# Patient Record
Sex: Male | Born: 1975 | Hispanic: Yes | Marital: Single | Smoking: Never smoker
Health system: Southern US, Community
[De-identification: ages and names within clinical notes are randomized; demographics above are authoritative.]

---

## 2008-12-07 ENCOUNTER — Emergency Department (HOSPITAL_COMMUNITY): Admission: EM | Admit: 2008-12-07 | Discharge: 2008-12-07 | Payer: Self-pay | Admitting: Emergency Medicine

## 2010-02-09 IMAGING — CR DG RIBS BILAT 3V
3 series · 3 of 3 positions shown · non-contrast
Comparison: None

CLINICAL DATA: Fall with bilateral rib pain.

BILATERAL RIBS - 3+ VIEW

[w ribs ap/pa upper right]
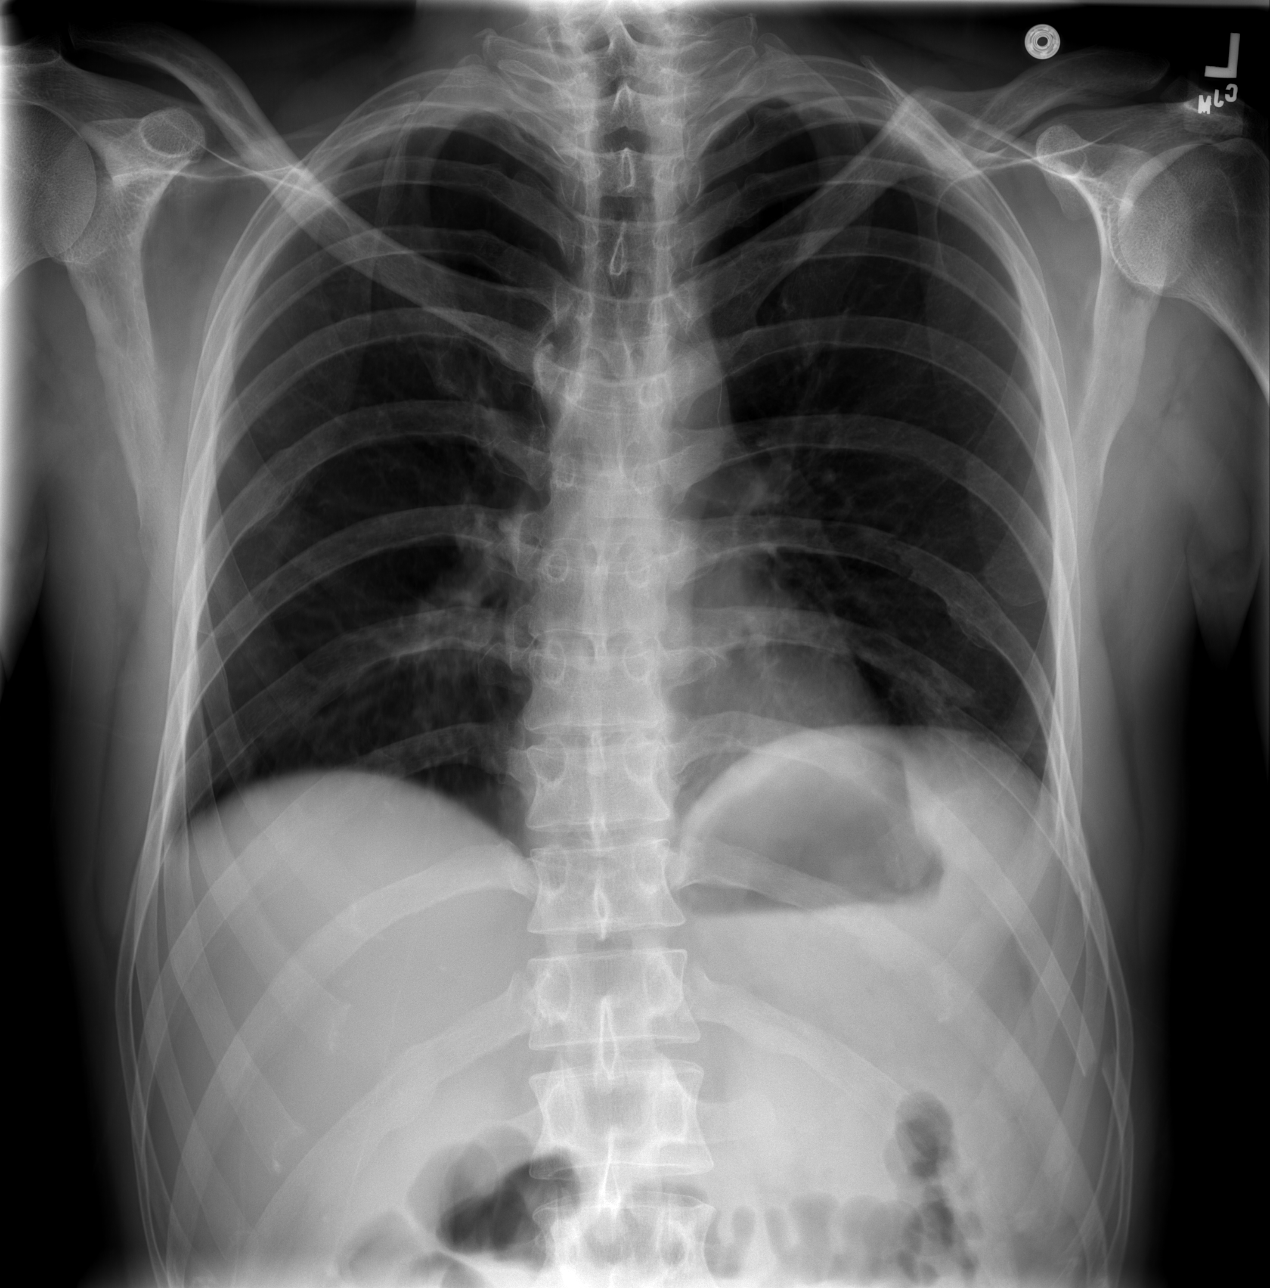

[w ribs ap/pa upper left]
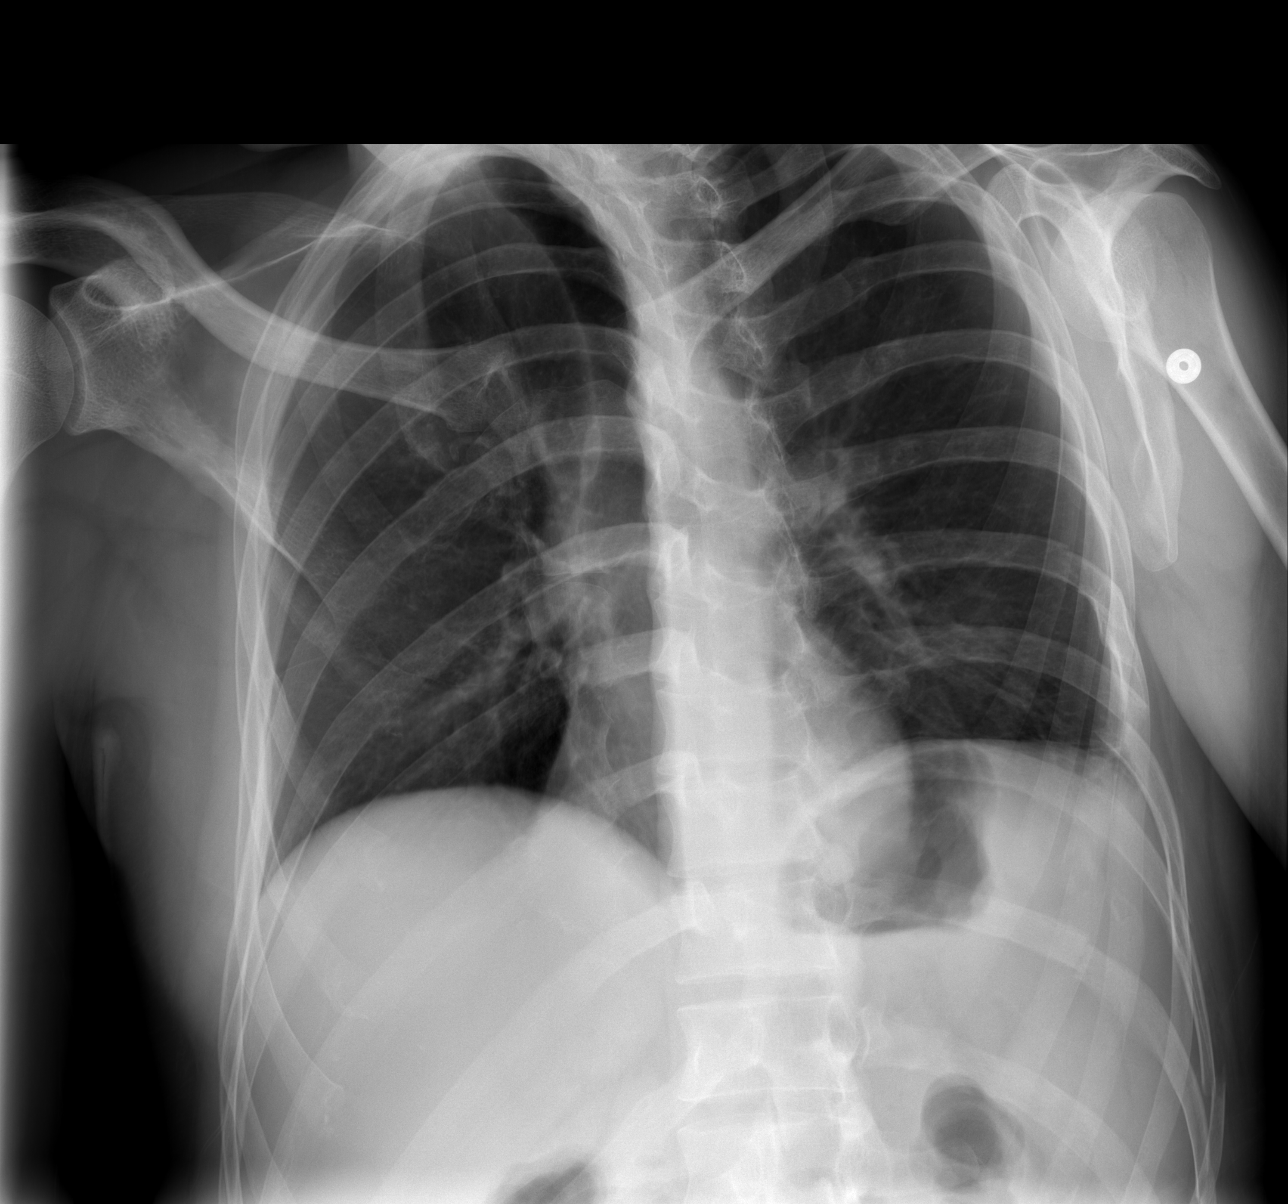

[w ribs ap/pa lower left]
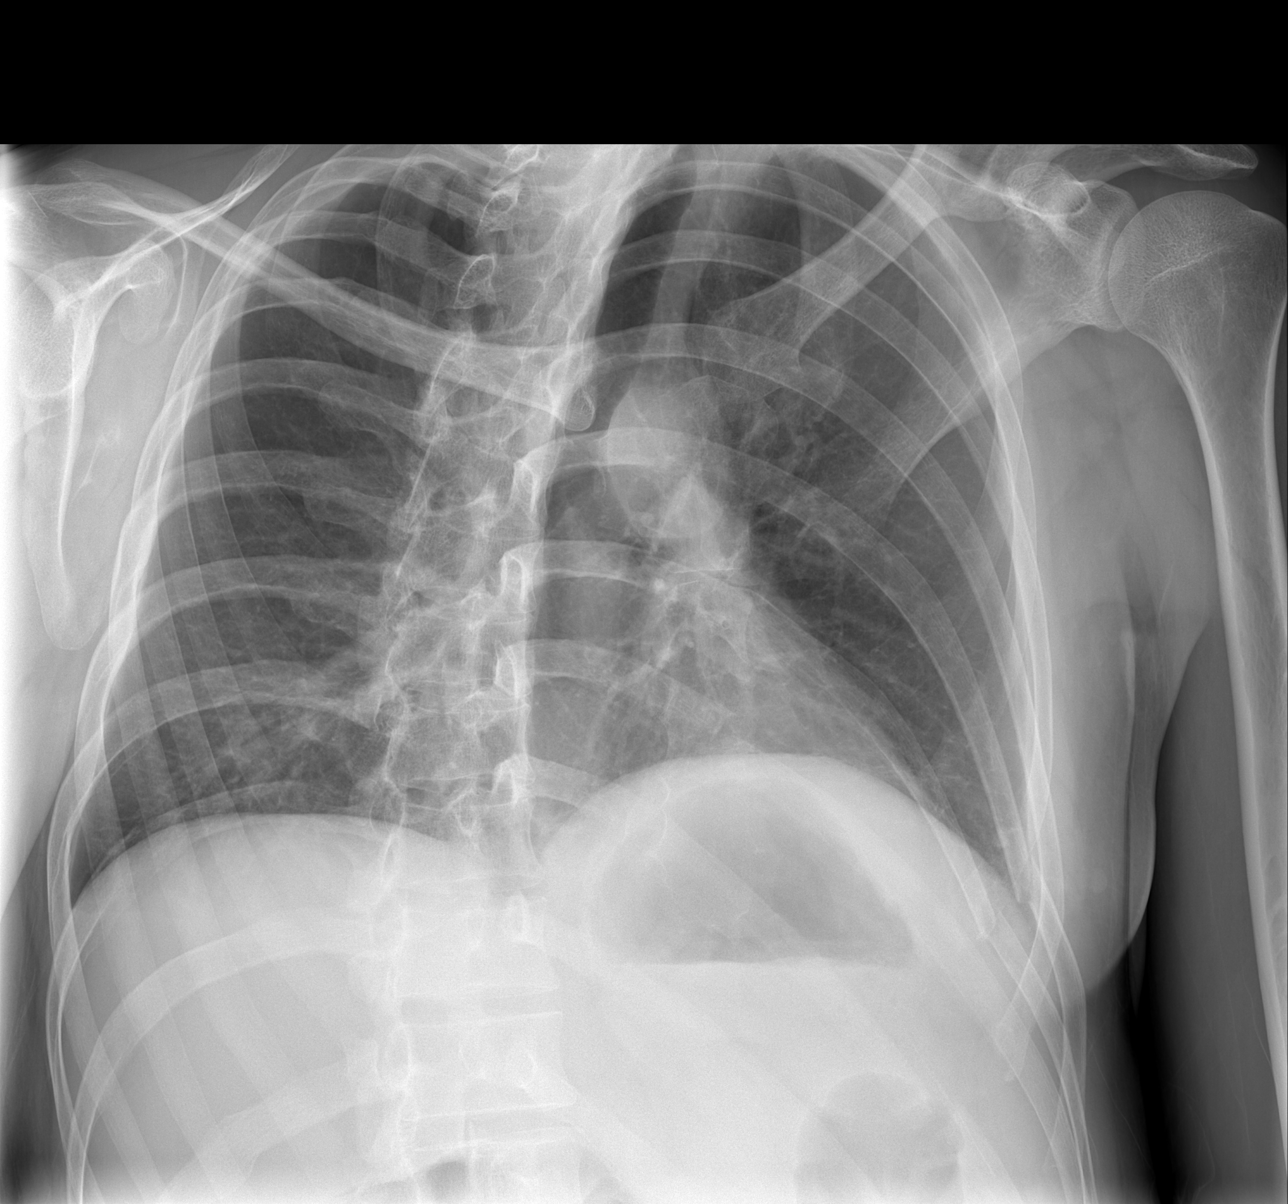

[3 of 3 positions shown; findings below may reference images not displayed]

FINDINGS: Trachea is midline.  Heart size normal.  Fractures of
varying displacement are seen at the left 7th through 11th ribs.
There are old right rib fractures.
IMPRESSION: 1.  Left 7th through 11th posterior and lateral rib fractures.
2.  Healed right rib fractures.

## 2010-02-09 IMAGING — CR DG CHEST 2V
2 series · 2 of 2 positions shown · non-contrast
Comparison: None

CLINICAL DATA: Fall with bilateral rib pain.

CHEST - 2 VIEW

[w chest pa]
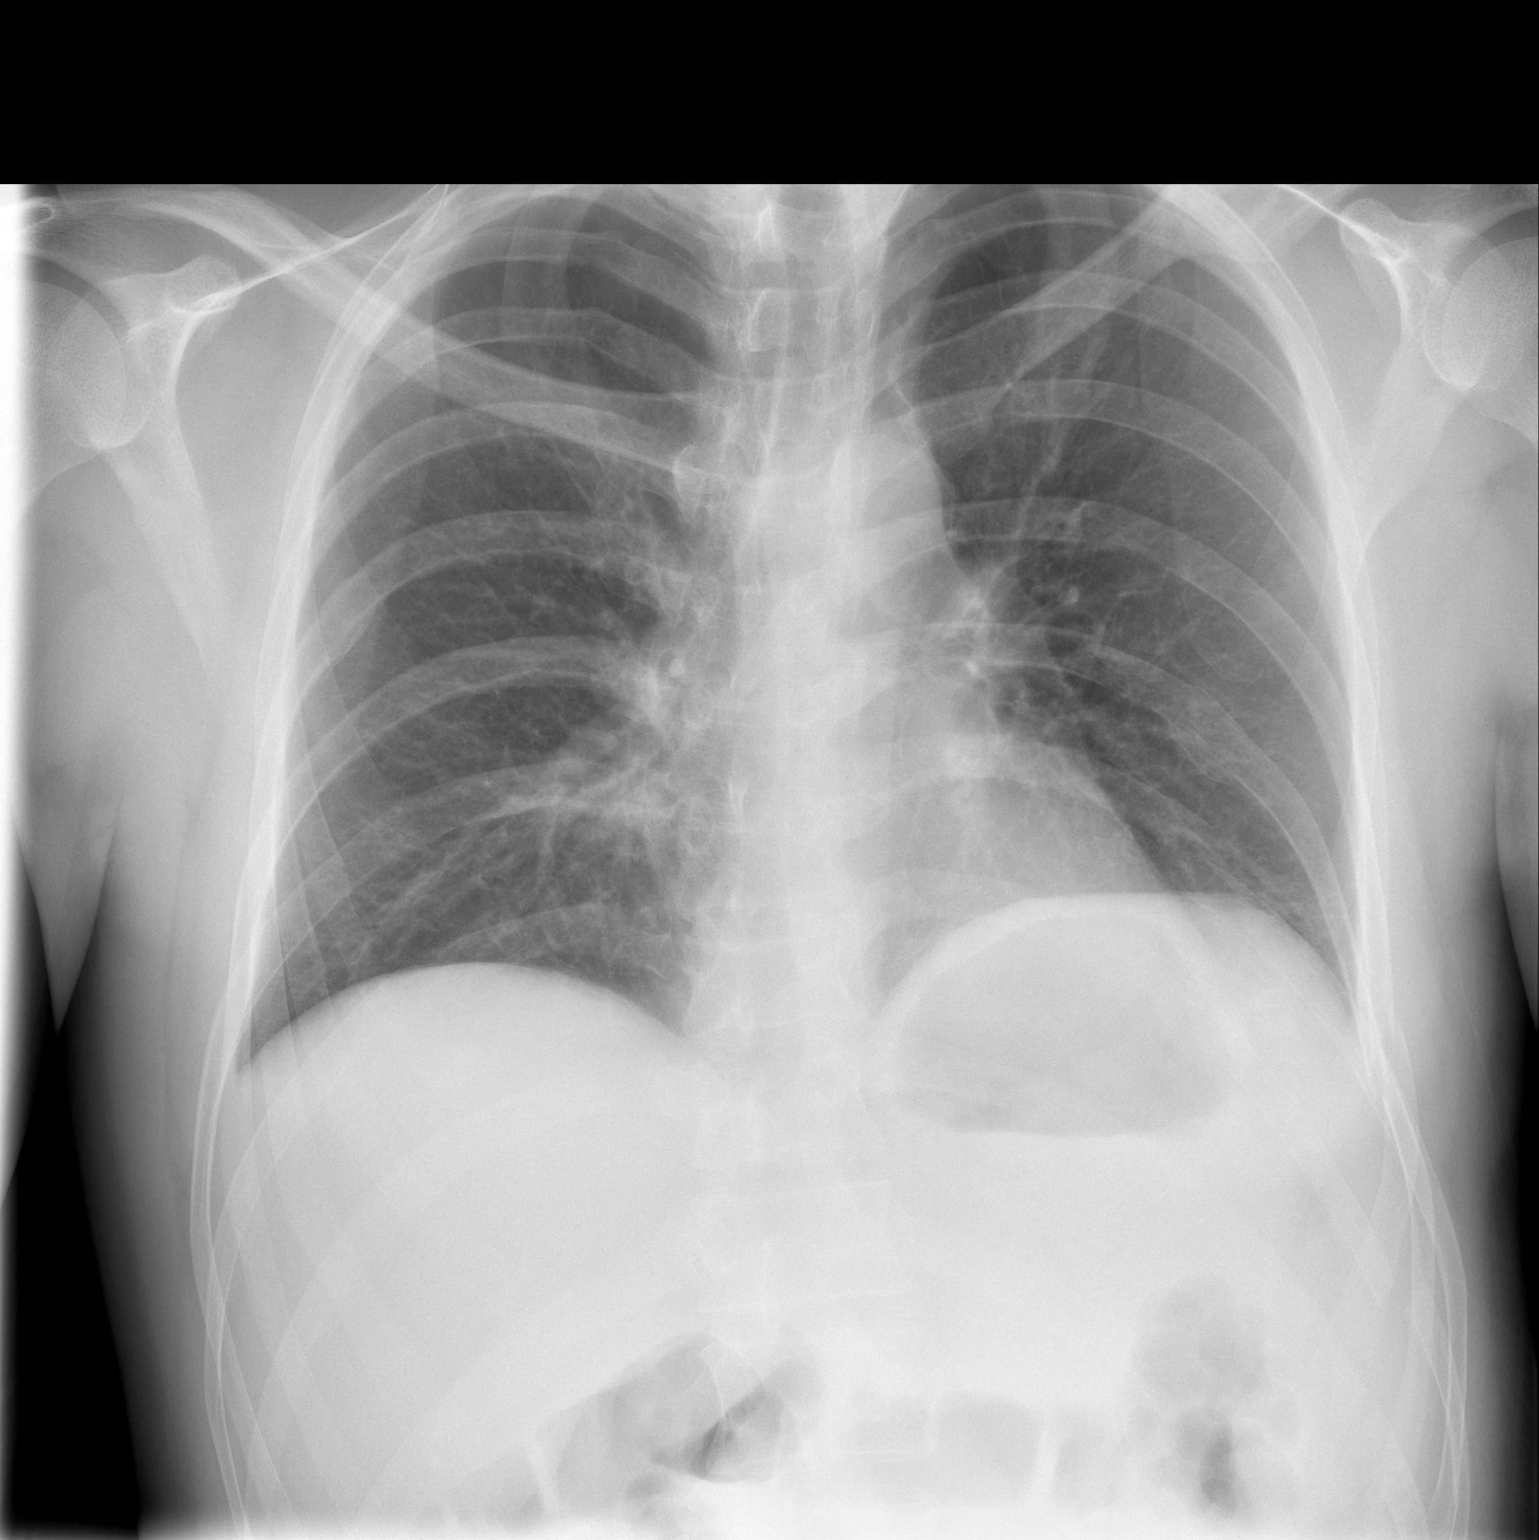

[w chest lat]
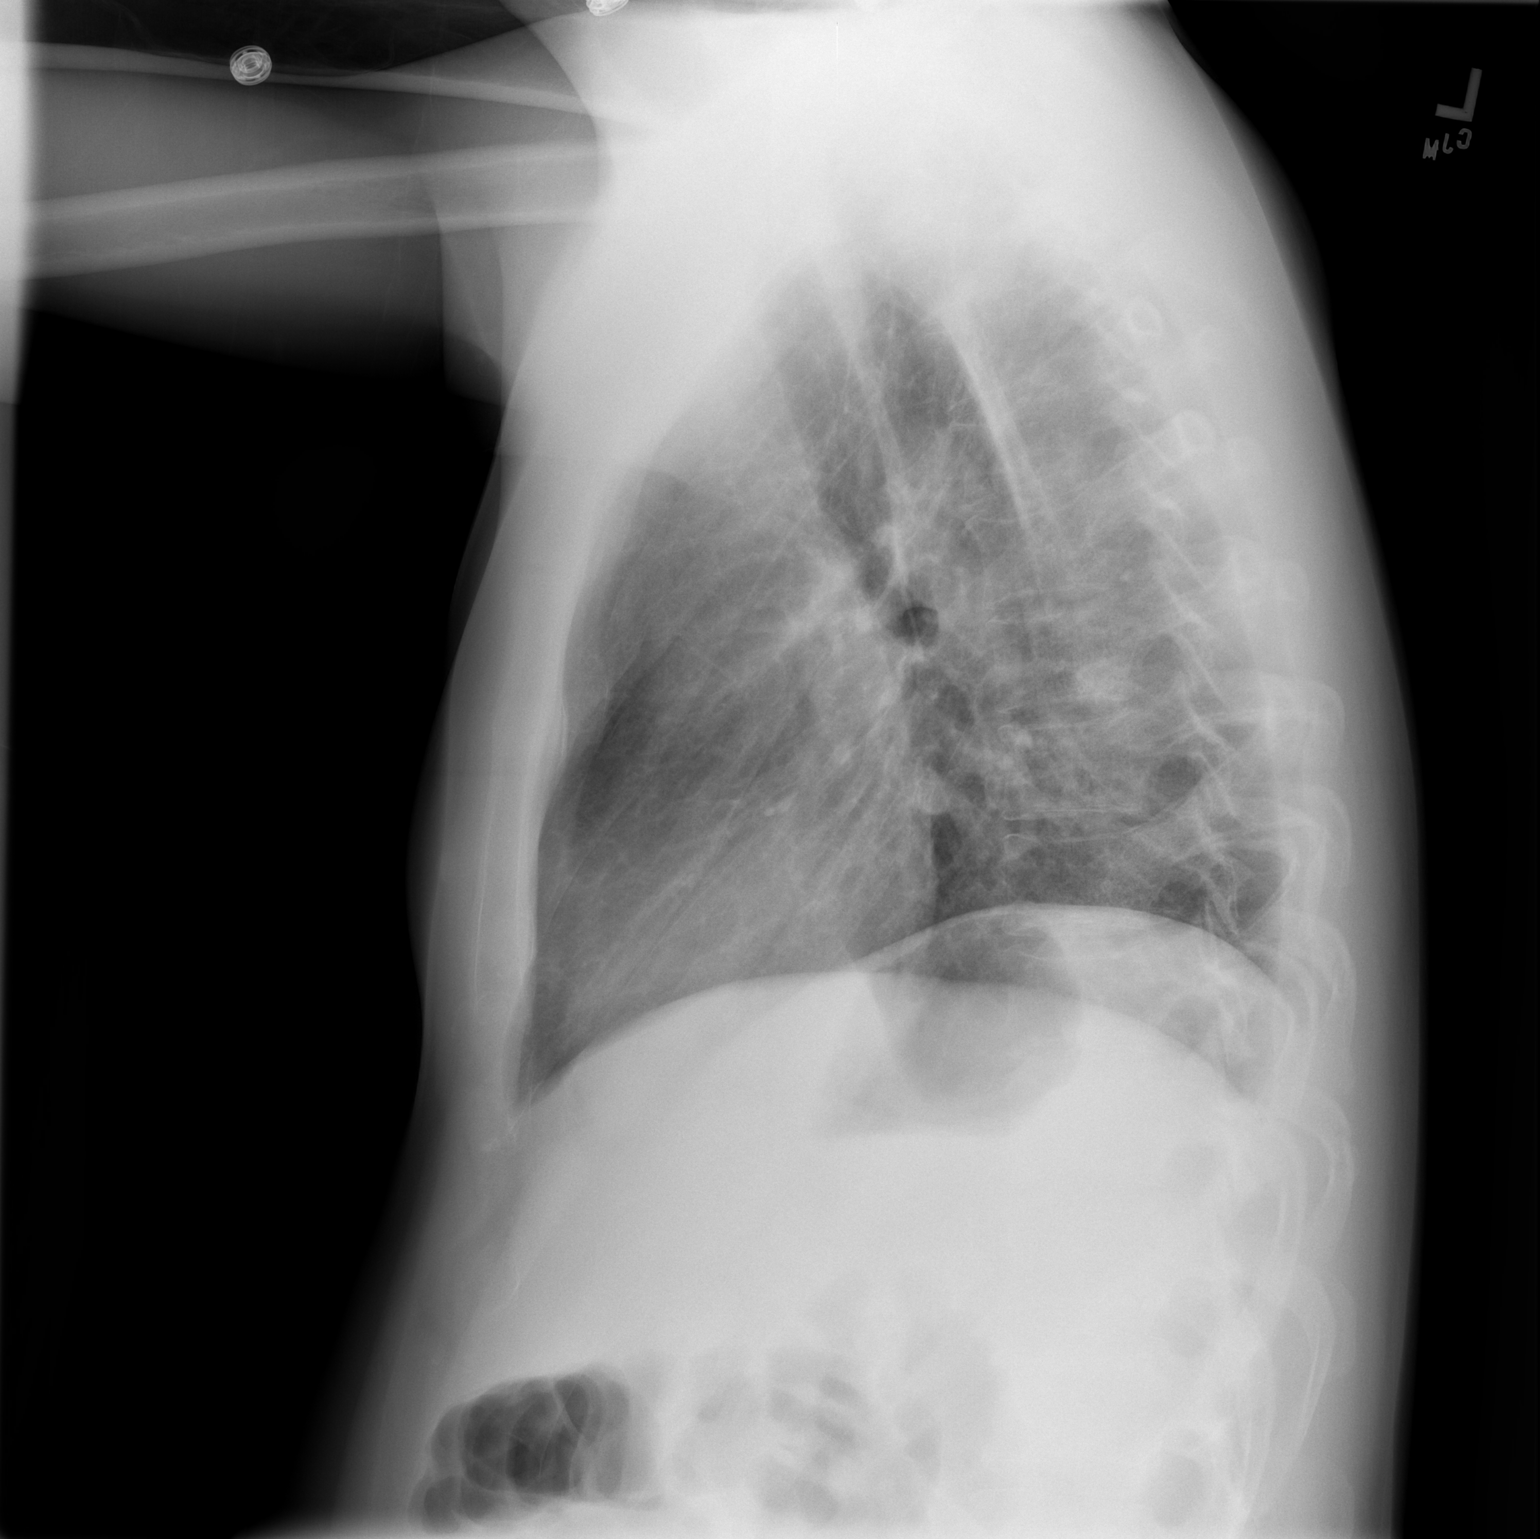

[2 of 2 positions shown; findings below may reference images not displayed]

FINDINGS: Trachea is midline.  Heart size normal.  There is minimal
left basilar atelectasis.  Lungs otherwise clear.  No pleural
fluid.  Left hemidiaphragm is elevated.

Multiple acute lower left rib fractures are seen.  There are healed
rib fractures bilaterally.
IMPRESSION: Acute lower left rib fractures.

## 2011-07-22 ENCOUNTER — Emergency Department (HOSPITAL_COMMUNITY): Payer: Self-pay

## 2011-07-22 ENCOUNTER — Inpatient Hospital Stay (HOSPITAL_COMMUNITY)
Admission: EM | Admit: 2011-07-22 | Discharge: 2011-07-25 | DRG: 086 | Disposition: A | Discharge status: Home / Self Care | Payer: Self-pay | Attending: Surgery | Admitting: Surgery

## 2011-07-22 DIAGNOSIS — S2249XA Multiple fractures of ribs, unspecified side, initial encounter for closed fracture: Secondary | ICD-10-CM | POA: Diagnosis present

## 2011-07-22 DIAGNOSIS — E876 Hypokalemia: Secondary | ICD-10-CM | POA: Diagnosis present

## 2011-07-22 DIAGNOSIS — F1010 Alcohol abuse, uncomplicated: Secondary | ICD-10-CM | POA: Diagnosis present

## 2011-07-22 DIAGNOSIS — S02109A Fracture of base of skull, unspecified side, initial encounter for closed fracture: Principal | ICD-10-CM | POA: Diagnosis present

## 2011-07-22 DIAGNOSIS — Y998 Other external cause status: Secondary | ICD-10-CM

## 2011-07-22 DIAGNOSIS — S060XAA Concussion with loss of consciousness status unknown, initial encounter: Secondary | ICD-10-CM

## 2011-07-22 DIAGNOSIS — S02401A Maxillary fracture, unspecified, initial encounter for closed fracture: Secondary | ICD-10-CM | POA: Diagnosis present

## 2011-07-22 DIAGNOSIS — F1410 Cocaine abuse, uncomplicated: Secondary | ICD-10-CM | POA: Diagnosis present

## 2011-07-22 DIAGNOSIS — S0280XA Fracture of other specified skull and facial bones, unspecified side, initial encounter for closed fracture: Secondary | ICD-10-CM

## 2011-07-22 DIAGNOSIS — R51 Headache: Secondary | ICD-10-CM

## 2011-07-22 DIAGNOSIS — S060X9A Concussion with loss of consciousness of unspecified duration, initial encounter: Secondary | ICD-10-CM

## 2011-07-22 DIAGNOSIS — G8911 Acute pain due to trauma: Secondary | ICD-10-CM

## 2011-07-22 DIAGNOSIS — S02400A Malar fracture unspecified, initial encounter for closed fracture: Secondary | ICD-10-CM | POA: Diagnosis present

## 2011-07-22 DIAGNOSIS — W1789XA Other fall from one level to another, initial encounter: Secondary | ICD-10-CM | POA: Diagnosis present

## 2011-07-22 LAB — BASIC METABOLIC PANEL
BUN: 4 mg/dL — ABNORMAL LOW (ref 6–23)
CO2: 21 mEq/L (ref 19–32)
Calcium: 8.3 mg/dL — ABNORMAL LOW (ref 8.4–10.5)
Chloride: 102 mEq/L (ref 96–112)
Creatinine, Ser: 0.56 mg/dL (ref 0.50–1.35)
Glucose, Bld: 112 mg/dL — ABNORMAL HIGH (ref 70–99)

## 2011-07-22 LAB — URINALYSIS, ROUTINE W REFLEX MICROSCOPIC
Bilirubin Urine: NEGATIVE
Glucose, UA: NEGATIVE mg/dL
Ketones, ur: NEGATIVE mg/dL
Leukocytes, UA: NEGATIVE
Protein, ur: NEGATIVE mg/dL
pH: 5 (ref 5.0–8.0)

## 2011-07-22 LAB — CBC
HCT: 40.6 % (ref 39.0–52.0)
Hemoglobin: 13.9 g/dL (ref 13.0–17.0)
MCH: 30.8 pg (ref 26.0–34.0)
MCHC: 34.2 g/dL (ref 30.0–36.0)
MCV: 89.8 fL (ref 78.0–100.0)
RBC: 4.52 MIL/uL (ref 4.22–5.81)

## 2011-07-22 LAB — DIFFERENTIAL
Basophils Relative: 0 % (ref 0–1)
Lymphs Abs: 1.2 10*3/uL (ref 0.7–4.0)
Monocytes Absolute: 0.5 10*3/uL (ref 0.1–1.0)
Monocytes Relative: 6 % (ref 3–12)
Neutro Abs: 6.3 10*3/uL (ref 1.7–7.7)

## 2011-07-22 LAB — ETHANOL: Alcohol, Ethyl (B): 412 mg/dL (ref 0–11)

## 2011-07-22 LAB — URINE MICROSCOPIC-ADD ON

## 2011-07-23 ENCOUNTER — Inpatient Hospital Stay (HOSPITAL_COMMUNITY): Payer: Self-pay

## 2011-07-23 LAB — CBC
HCT: 39.9 % (ref 39.0–52.0)
MCH: 30 pg (ref 26.0–34.0)
MCV: 91.3 fL (ref 78.0–100.0)
Platelets: 204 10*3/uL (ref 150–400)
RDW: 13.2 % (ref 11.5–15.5)
WBC: 6.9 10*3/uL (ref 4.0–10.5)

## 2011-07-23 LAB — BASIC METABOLIC PANEL
BUN: 4 mg/dL — ABNORMAL LOW (ref 6–23)
CO2: 23 mEq/L (ref 19–32)
Calcium: 8.1 mg/dL — ABNORMAL LOW (ref 8.4–10.5)
Chloride: 108 mEq/L (ref 96–112)
Creatinine, Ser: 0.53 mg/dL (ref 0.50–1.35)

## 2011-07-23 LAB — RAPID URINE DRUG SCREEN, HOSP PERFORMED
Amphetamines: NOT DETECTED
Cocaine: POSITIVE — AB
Opiates: NOT DETECTED
Tetrahydrocannabinol: NOT DETECTED

## 2011-07-24 ENCOUNTER — Encounter (HOSPITAL_COMMUNITY): Payer: Self-pay | Admitting: Radiology

## 2011-07-24 ENCOUNTER — Inpatient Hospital Stay (HOSPITAL_COMMUNITY): Payer: Self-pay

## 2011-07-24 LAB — URINE CULTURE
Colony Count: NO GROWTH
Culture: NO GROWTH

## 2011-08-01 NOTE — H&P (Signed)
NAMEKARLTON, MAYA NO.:  [id number]  MEDICAL RECORD NO.:  [id number]  LOCATION:  [facility]                         FACILITY:  [facility]  PHYSICIAN:  Almond Lint, MD       DATE OF BIRTH:  10-27-1975  DATE OF ADMISSION:  07/22/2011 DATE OF DISCHARGE:                             HISTORY & PHYSICAL   REQUESTING PHYSICIAN:  Shelda Jakes, MD.  HISTORY OF PRESENT ILLNESS:  Mr. Adrian Hall is a 35 year old male who fell 3 feet onto the concrete.  He struck the left side of his face.  He does not recall the event.  He states that he did drink alcohol yesterday, but does not really remember how much.  He complains of a mild headache. He denies nausea, vomiting, shortness of breath, or chest pain.  He denies abdominal pain.  He has not had any dizziness, and he says he does have some neck pain.  PAST MEDICAL HISTORY:  He states that in 2000 and 2001 he had some facial fractures, clavicle fracture on the left and a possible neck fracture in the past.  PAST SURGICAL HISTORY:  He denies.  SOCIAL HISTORY:  He does endorse alcohol use.  ALLERGIES:  None.  MEDICATIONS:  He states that he took some pills last week for headaches but does not take them every day.  PHYSICAL EXAMINATION:  VITAL SIGNS:  Temperature 98.7, pulse 94, respiratory rate 16, and blood pressure 122/78. SKIN:  This will be in each body system. HEENT:  He has hematoma over the left parietotemporal area.  There are some abrasions here but no laceration.  Eyes, he has conjunctival hyperemia and a preseptal hematoma on the left.  His extraocular movements are intact and his pupils are equal, round, and reactive to light.  Ears, has cerumen in both canals, but no blood in the auditory canal.  His face is tender on the left, but stable.  His left zygoma is tender. NECK:  He does complain of neck pain with palpation. LUNGS:  Clear bilaterally.  Chest wall is nontender. HEART:  Regular rate and rhythm.  No  murmurs. ABDOMEN:  Soft, nontender, nondistended. PELVIS:  Stable. MUSCULOSKELETAL:  All 4 extremities without significant signs of trauma. There is a very superficial abrasion over the right wrist. BACK:  Nontender and atraumatic.  There is step-off. NEUROLOGIC:  No gross motor or sensory deficits.  LABORATORY DATA:  Sodium 138, potassium 3.4, chloride 102, CO2 21, BUN 4, creatinine 0.56, and glucose 112.  White count 8.1, hemoglobin 13.9, hematocrit 40.6, platelet count of 205,000.  Alcohol level is 412.  CT scan of the head shows a 2.1 cm extraocular hemorrhage, and left oropharyngeal fossa and left temporal bone fracture.  Neck shows spondylolysis.  The patient has a left zygomatic fracture, left sphenoid wing fracture, left temporal bone, lateral orbital wall, and possible left pterygoid plate fractures.  C spine cannot be cleared at this time due to intoxication.  ASSESSMENT AND PLAN:  This is a 35 year old male status post fall with a question epidural hematoma, multiple bone fractures, brain injury, left facial fractures, alcohol use, and hypokalemia.  We will hydrate the patient and get a facial trauma consult and  neurosurgery consult.  He will be admitted to 3100 with neurologic check.  He will remain n.p.o. while these evaluations are going on.  He can be placed in a [place] J collar.     Almond Lint, MD     FB/MEDQ  D:  07/22/2011  T:  07/23/2011  Job:  [id number]  cc:   Shelda Jakes, MD  Electronically Signed by Almond Lint MD on 08/01/2011 07:58:35 PM

## 2011-08-13 NOTE — Discharge Summary (Signed)
NAMESUFYAAN, PALMA NO.:  [id number]  MEDICAL RECORD NO.:  [id number]  LOCATION:  3023                         FACILITY:  [facility]  PHYSICIAN:  Gabrielle Dare. Janee Morn, M.D.DATE OF BIRTH:  [birth year]  DATE OF ADMISSION:  07/22/2011 DATE OF DISCHARGE:  07/25/2011                              DISCHARGE SUMMARY   DISCHARGE DIAGNOSES: 1. Fall. 2. 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 3. Multiple facial fractures, left zygomatic arch, left temporal bone,     left sphenoid, and left orbit. 4. Possible acute left eighth and ninth rib fractures.  PAST MEDICAL HISTORY: 1. ETOH use, chronic possible cocaine abuse. 2. History of previous bilateral rib fractures with evidence of     multiple healing fractures on x-ray. 3. History of clavicle fracture. 4. History of previous facial fractures.  HISTORY ON ADMISSION:  This is a 35 year old Hispanic male who reportedly fell about 3 feet onto concrete.  He was apparently on some steps.  He struck the left side of his face.  He is unable to recall the event and may have had a brief loss of consciousness.  He apparently had been drinking heavily and watching a soccer game with his family.  He presented to the ED complaining of a mild headache, but denied any nausea or vomiting or  shortness of breath.  Workup at this time including a C-spine CT was negative.  Head CT showed a 2.1 cm extra-axial lenticular shaped hemorrhage in the left middle cranial fossa just adjacent to a greater sphenoid wing fracture and temporal bone fracture.  It was felt this was consistent with an epidural hematoma.  Maxillofacial CT scan showed a left zygomatic arch fracture, greater sphenoid wing fracture, left orbital fracture and left temporal bone fracture, all of which were minimally to nondisplaced. Chest x-ray showed a left posterior eighth and ninth rib fractures.  It is not known whether these are acute or  subacute.  The patient was admitted to the Neurointensive Care Unit.  He was seen in consultation by Dr. Phoebe Perch of Neurosurgery who recommended a followup CT scan.  This was done on July 24, 2011 and did show a stable left middle cranial fossa epidural hematoma which is approximately 22 x 16 mm.  This was associated with a comminuted left sphenoid and zygoma fracture at the skull base.  There was no mass-effect and his CT was otherwise normal.  The patient's Glasgow coma score had been 15 since admission and he was mobilized with PT and OT and did well with this without any significant balance difficulties or other deficits.  No therapies were recommended on followup.  No formal neurosurgical followup was recommended.  The patient was seen by Dr. Jeanice Lim for maxillofacial surgery and it was felt that the patient would need outpatient followup in 1-2 weeks to reassess his facial fractures.  At this point, they are nonoperative.  The patient otherwise had a benign hospital stay.  He was seen in consultation by the Social Work for substance abuse.  He did score in the risky or hazardous level on his SBIRT and was provided information for resources but the patient declined these at this time.  He denied any  other substance use besides alcohol.  At this time, the patient is medically stable and ready for discharge.  DIET AT DISCHARGE:  Regular.  RETURN TO WORK:  In 10 days, approximately on August 04, 2011.  MEDICATIONS AT DISCHARGE:  Percocet 5/325 one to two p.o. q.4 hours p.r.n. pain, #80 with no refill.  FOLLOWUP:  Follow up will be as needed with Trauma Service and with Neurosurgery.  He can follow up with Dr. Jeanice Lim, ENT in 1-2 weeks.     Shawn Rayburn, P.A.   ______________________________ Gabrielle Dare Janee Morn, M.D.    SR/MEDQ  D:  07/25/2011  T:  07/25/2011  Job:  [id number]  Electronically Signed by Lazaro Arms P.A. on 08/01/2011 01:51:52 PM Electronically Signed by  Violeta Gelinas M.D. on 08/13/2011 08:49:47 AM

## 2022-08-12 ENCOUNTER — Other Ambulatory Visit: Payer: Self-pay

## 2022-08-12 ENCOUNTER — Ambulatory Visit (HOSPITAL_COMMUNITY)
Admission: EM | Admit: 2022-08-12 | Discharge: 2022-08-12 | Disposition: A | Discharge status: Other Acute Inpatient Hospital | Payer: Self-pay | Attending: Internal Medicine | Admitting: Internal Medicine

## 2022-08-12 ENCOUNTER — Emergency Department (HOSPITAL_COMMUNITY): Payer: Self-pay

## 2022-08-12 ENCOUNTER — Ambulatory Visit (HOSPITAL_COMMUNITY): Payer: Self-pay

## 2022-08-12 ENCOUNTER — Emergency Department (HOSPITAL_COMMUNITY)
Admission: EM | Admit: 2022-08-12 | Discharge: 2022-08-12 | Disposition: A | Discharge status: Home / Self Care | Payer: Self-pay | Attending: Emergency Medicine | Admitting: Emergency Medicine

## 2022-08-12 ENCOUNTER — Encounter (HOSPITAL_COMMUNITY): Payer: Self-pay

## 2022-08-12 DIAGNOSIS — S0101XA Laceration without foreign body of scalp, initial encounter: Secondary | ICD-10-CM | POA: Insufficient documentation

## 2022-08-12 DIAGNOSIS — W19XXXA Unspecified fall, initial encounter: Secondary | ICD-10-CM

## 2022-08-12 DIAGNOSIS — S42492A Other displaced fracture of lower end of left humerus, initial encounter for closed fracture: Secondary | ICD-10-CM

## 2022-08-12 DIAGNOSIS — S01111A Laceration without foreign body of right eyelid and periocular area, initial encounter: Secondary | ICD-10-CM

## 2022-08-12 DIAGNOSIS — S42402A Unspecified fracture of lower end of left humerus, initial encounter for closed fracture: Secondary | ICD-10-CM | POA: Insufficient documentation

## 2022-08-12 DIAGNOSIS — S0181XA Laceration without foreign body of other part of head, initial encounter: Secondary | ICD-10-CM

## 2022-08-12 DIAGNOSIS — M7989 Other specified soft tissue disorders: Secondary | ICD-10-CM | POA: Insufficient documentation

## 2022-08-12 DIAGNOSIS — M25522 Pain in left elbow: Secondary | ICD-10-CM

## 2022-08-12 DIAGNOSIS — W11XXXA Fall on and from ladder, initial encounter: Secondary | ICD-10-CM | POA: Insufficient documentation

## 2022-08-12 MED ORDER — OXYCODONE HCL 5 MG PO TABS: 5.0000 mg | ORAL_TABLET | Freq: Four times a day (QID) | ORAL | 0 refills | Status: DC | PRN

## 2022-08-12 MED ORDER — HYDROMORPHONE HCL 1 MG/ML IJ SOLN
1.0000 mg | Freq: Once | INTRAMUSCULAR | Status: AC
  Administered 2022-08-12: 1 mg via INTRAVENOUS
  Filled 2022-08-12: qty 1

## 2022-08-12 MED ORDER — IBUPROFEN 600 MG PO TABS: 600.0000 mg | ORAL_TABLET | Freq: Four times a day (QID) | ORAL | 0 refills | Status: DC | PRN

## 2022-08-12 MED ORDER — TETANUS-DIPHTH-ACELL PERTUSSIS 5-2.5-18.5 LF-MCG/0.5 IM SUSY
0.5000 mL | PREFILLED_SYRINGE | Freq: Once | INTRAMUSCULAR | Status: AC
  Administered 2022-08-12: 0.5 mL via INTRAMUSCULAR

## 2022-08-12 MED ORDER — LIDOCAINE-EPINEPHRINE 1 %-1:100000 IJ SOLN
10.0000 mL | Freq: Once | INTRAMUSCULAR | Status: AC
  Administered 2022-08-12: 10 mL via INTRADERMAL
  Filled 2022-08-12: qty 1

## 2022-08-12 MED ORDER — TETANUS-DIPHTH-ACELL PERTUSSIS 5-2.5-18.5 LF-MCG/0.5 IM SUSY
PREFILLED_SYRINGE | INTRAMUSCULAR | Status: AC
  Filled 2022-08-12: qty 0.5

## 2022-08-12 MED ORDER — LIDOCAINE HCL 2 % IJ SOLN
20.0000 mL | INTRAMUSCULAR | Status: AC
  Administered 2022-08-12: 400 mg
  Filled 2022-08-12 (×2): qty 20

## 2022-08-12 MED ORDER — HYDROCODONE-ACETAMINOPHEN 5-325 MG PO TABS
1.0000 | ORAL_TABLET | Freq: Once | ORAL | Status: AC
  Administered 2022-08-12: 1 via ORAL

## 2022-08-12 MED ORDER — HYDROCODONE-ACETAMINOPHEN 5-325 MG PO TABS
ORAL_TABLET | ORAL | Status: AC
  Filled 2022-08-12: qty 1

## 2022-08-12 NOTE — ED Provider Notes (Signed)
    [name]    CSN: [id number] Arrival date & time: 08/12/22  1715      History   Chief Complaint Chief Complaint  Patient presents with   fall from ladder   Head Laceration   Arm Injury    HPI Adrian Hall is a 46 y.o. male.   Patient presents to urgent care for evaluation after falling from a 9 foot ladder this afternoon.      No past medical history on file.  There are no problems to display for this patient.   No past surgical history on file.     Home Medications    Prior to Admission medications   Not on File    Family History No family history on file.  Social History Social History   Tobacco Use   Smoking status: Never   Smokeless tobacco: Never  Vaping Use   Vaping Use: Never used     Allergies   Patient has no allergy information on record.   Review of Systems Review of Systems   Physical Exam Triage Vital Signs ED Triage Vitals [08/12/22 1728]  Enc Vitals Group     BP (!) 175/114     Pulse Rate 77     Resp 18     Temp      Temp src      SpO2 98 %     Weight      Height      Head Circumference      Peak Flow      Pain Score      Pain Loc      Pain Edu?      Excl. in [place]?    No data found.  Updated Vital Signs BP (!) 175/114 (BP Location: Right Leg)   Pulse 77   Resp 18   SpO2 98%   Visual Acuity Right Eye Distance:   Left Eye Distance:   Bilateral Distance:    Right Eye Near:   Left Eye Near:    Bilateral Near:     Physical Exam   UC Treatments / Results  Labs (all labs ordered are listed, but only abnormal results are displayed) Labs Reviewed - No data to display  EKG   Radiology No results found.  Procedures Procedures (including critical care time)  Medications Ordered in UC Medications  HYDROcodone-acetaminophen (NORCO/VICODIN) 5-325 MG per tablet 1 tablet (1 tablet Oral Given 08/12/22 1747)  Tdap (BOOSTRIX) injection 0.5 mL (0.5 mLs Intramuscular Given 08/12/22  1752)    Initial Impression / Assessment and Plan / UC Course  I have reviewed the triage vital signs and the nursing notes.  Pertinent labs & imaging results that were available during my care of the patient were reviewed by me and considered in my medical decision making (see chart for details).     *** Final Clinical Impressions(s) / UC Diagnoses   Final diagnoses:  Fall, initial encounter   Discharge Instructions   None    ED Prescriptions   None    I have reviewed the PDMP during this encounter.

## 2022-08-12 NOTE — Progress Notes (Signed)
Orthopedic Tech Progress Note Patient Details:  Adrian Hall 28-Dec-1975 [id number]  Ortho Devices Type of Ortho Device: Post (long arm) splint, Sling immobilizer Ortho Device/Splint Location: lue. Ortho Device/Splint Interventions: Ordered, Application  I applied splint with dr Phoebe Sharps help post reduction. Sling will go on after ct per dr Phoebe Sharps request.  Post Interventions Patient Tolerated: Well Instructions Provided: Care of device, Adjustment of device  Adrian Hall 08/12/2022, 9:06 PM

## 2022-08-12 NOTE — ED Triage Notes (Signed)
Patient presents from Urgent Care via [place]. [facility] reports he fell 9 foot from a ladder. He has a laceration to his central forehead, also reported that he hit both elbows. Left elbow has deformity. Patient also states his right arm is hurts also.  No known allergies, patient took a PO vicodin at urgent care.  [facility] vitals:  BP 175/114 P 77 R 18 Oxygen 98%

## 2022-08-12 NOTE — Discharge Instructions (Addendum)
You should wear the arm splint at all time at home.  If you need to shower, you can rubber band a plastic bag over the cast to keep it dry.  You can take the sling off in bed at night, but should still wear the cast.  Please keep this dry.  For pain you can take ibuprofen and Tylenol regularly.  For more severe pain you can take oxycodone which is a narcotic.  Do not drive after taking this medication.  Please call Dr Tama Headings office Monday morning to schedule follow-up appointment.  Please tell them that you were in the emergency department with an elbow fracture.  Do not use your left hand to pick up any weight or for any activities.  *  You also had 5 stitches placed in your forehead.  The stitches need to be removed in 5 to 7 days.  The stitches can be removed in any doctor's office, or in urgent care.  You do not need to come back to the ER to have your stitches removed.  You can shower normally with soap and water.  The stitches do not dissolve

## 2022-08-12 NOTE — ED Triage Notes (Signed)
[facility] present to transport PT. Unable obtain IV access , Report called to [facility] ,ED

## 2022-08-12 NOTE — ED Triage Notes (Signed)
Pt  fell from ladder that was approx 51ft tall . Pt reports the ladder slipped and pt fell on to concrete and hit fore head the patient reports he did not pass out. . Pt reports pain to Lt arm in elbow and Rt arm. . Pt denies back pain and denies Neck pain.Pt now reports he felt like passing out but did not. Provider at bedside to eval pt. Pt A/O x4 and speaking in full sentences.

## 2022-08-12 NOTE — Consult Note (Signed)
   ORTHOPAEDIC CONSULTATION  REQUESTING PHYSICIAN: Trifan, Carola Rhine, MD  Chief Complaint: Left elbow injury  HPI: Adrian Hall is a 46 y.o. male who presents with left elbow injury s/p 9 ft fall from ladder while at work.  Works as a Curator.  Reports right arm hurts as well.  Has head laceration.    History reviewed. No pertinent past medical history. History reviewed. No pertinent surgical history. Social History   Socioeconomic History   Marital status: Single    Spouse name: Not on file   Number of children: Not on file   Years of education: Not on file   Highest education level: Not on file  Occupational History   Not on file  Tobacco Use   Smoking status: Never   Smokeless tobacco: Never  Vaping Use   Vaping Use: Never used  Substance and Sexual Activity   Alcohol use: Not on file   Drug use: Not on file   Sexual activity: Not on file  Other Topics Concern   Not on file  Social History Narrative   Not on file   Social Determinants of Health   Financial Resource Strain: Not on file  Food Insecurity: Not on file  Transportation Needs: Not on file  Physical Activity: Not on file  Stress: Not on file  Social Connections: Not on file   History reviewed. No pertinent family history. - negative except otherwise stated in the family history section Not on File Prior to Admission medications   Not on File   DG Elbow Complete Left  Result Date: 08/12/2022 CLINICAL DATA:  Fall and trauma to the left elbow. EXAM: LEFT ELBOW - COMPLETE 3+ VIEW COMPARISON:  None Available. FINDINGS: There is a comminuted, and displaced supracondylar fracture with extension of the fracture into the articular surface of the distal humerus. There is anterior displacement of the radius and ulna in relation to the humeral diaphysis. There is soft tissue swelling of the elbow. IMPRESSION: Comminuted, displaced, intra-articular supracondylar fracture. Electronically Signed   By: Anner Crete M.D.   On: 08/12/2022 19:42   - pertinent xrays, CT, MRI studies were reviewed and independently interpreted  Positive ROS: All other systems have been reviewed and were otherwise negative with the exception of those mentioned in the HPI and as above.  Physical Exam: General: No acute distress Cardiovascular: No pedal edema Respiratory: No cyanosis, no use of accessory musculature GI: No organomegaly, abdomen is soft and non-tender Skin: No lesions in the area of chief complaint Neurologic: Sensation intact distally Psychiatric: Patient is at baseline mood and affect Lymphatic: No axillary or cervical lymphadenopathy  MUSCULOSKELETAL:  LUE - strong radial pulse - intact radial, median, ulnar, radial nerve - normal cap refill  RUE - bruising of upper arm - tenderness to the biceps muscle belly - shoulder and elbow ROM are well tolerated  Assessment: Left distal humerus fx  Plan: - hematoma block placed and length was pulled through the fracture and splinted - will obtain CT scan left elbow - will obtain xrays of right shoulder, humerus and elbow to r/o occult fx - I have discussed this patient with Dr. Doreatha Martin who has agreed to perform ORIF next week - I have given instructions to patient to call Dr. Tama Headings office first thing Monday am   Thank you for the consult and the opportunity to see Mr. Adrian Hall. Eduard Roux, MD [facility] 9:07 PM

## 2022-08-12 NOTE — ED Provider Notes (Signed)
[place] EMERGENCY DEPARTMENT Provider Note   CSN: [id number] Arrival date & time: 08/12/22  1832     History {Add pertinent medical, surgical, social history, OB history to HPI:1} Chief Complaint  Patient presents with   Fall    9 foot ladder     Adrian Hall is a 46 y.o. male presenting from urgent care after falling off of a ladder, striking his forehead and predominantly his left elbow on the ground.  Patient reports he has pain and swelling and limited ability to left elbow and wonders whether it is broken.  He says he went to an urgent care and had x-rays and was told that the left elbow was broken come to the ER.  He also has a laceration to the forehead reports he struck his head on the ground which was concrete.  He does not use blood thinners.  There is no loss of consciousness.  He denies pain anywhere else.  HPI     Home Medications Prior to Admission medications   Not on File      Allergies    Patient has no allergy information on record.    Review of Systems   Review of Systems  Physical Exam Updated Vital Signs BP 111/72   Pulse 73   Temp 98.1 F (36.7 C) (Oral)   Resp 18   Ht 6\' 2"  (1.88 m)   Wt 81.6 kg   SpO2 97%   BMI 23.11 kg/m  Physical Exam Constitutional:      General: He is not in acute distress. HENT:     Head: Normocephalic.      Comments: Four centimeter linear laceration above the left eyebrow Eyes:     Conjunctiva/sclera: Conjunctivae normal.     Pupils: Pupils are equal, round, and reactive to light.  Neck:     Comments: No cervical midline tenderness or spinal midline tenderness Cardiovascular:     Rate and Rhythm: Normal rate and regular rhythm.  Pulmonary:     Effort: Pulmonary effort is normal. No respiratory distress.  Abdominal:     General: There is no distension.     Tenderness: There is no abdominal tenderness.  Musculoskeletal:     Comments: Tenderness and swelling around the left  elbow, patient unable to range distal forearm due to pain in the elbow.  Remainder of extremities and joints appear normal range of motion, no tenderness, no pelvic instability  Skin:    General: Skin is warm and dry.  Neurological:     General: No focal deficit present.     Mental Status: He is alert and oriented to person, place, and time. Mental status is at baseline.  Psychiatric:        Mood and Affect: Mood normal.        Behavior: Behavior normal.     ED Results / Procedures / Treatments   Labs (all labs ordered are listed, but only abnormal results are displayed) Labs Reviewed - No data to display  EKG None  Radiology No results found.  Procedures Procedures  {Document cardiac monitor, telemetry assessment procedure when appropriate:1}  Medications Ordered in ED Medications  HYDROmorphone (DILAUDID) injection 1 mg (has no administration in time range)  lidocaine-EPINEPHrine (XYLOCAINE W/EPI) 1 %-1:100000 (with pres) injection 10 mL (has no administration in time range)    ED Course/ Medical Decision Making/ A&P  Medical Decision Making Amount and/or Complexity of Data Reviewed Radiology: ordered.  Risk Prescription drug management.   Patient is here with a traumatic fall off of a ladder about 9 feet, with isolated injuries to the head and left elbow.  X-rays of the elbow were ordered and personally reviewed and interpreted, notable for ***  CT of the head was ordered and reviewed interpreted, showing ***  The wound was irrigated copiously, and closed with sutures.  There were no other traumatic injuries noted on my evaluation to warrant further imaging at this time.  Dilaudid was given IV for pain.  {Document critical care time when appropriate:1} {Document review of labs and clinical decision tools ie heart score, Chads2Vasc2 etc:1}  {Document your independent review of radiology images, and any outside records:1} {Document  your discussion with family members, caretakers, and with consultants:1} {Document social determinants of health affecting pt's care:1} {Document your decision making why or why not admission, treatments were needed:1} Final Clinical Impression(s) / ED Diagnoses Final diagnoses:  None    Rx / DC Orders ED Discharge Orders     None

## 2022-08-12 NOTE — ED Notes (Signed)
Orthopedic and orthopedic tech at bedside.

## 2022-08-12 NOTE — ED Notes (Signed)
Patient transported to CT 

## 2022-08-14 ENCOUNTER — Ambulatory Visit: Payer: Self-pay | Admitting: Student

## 2022-08-16 NOTE — Progress Notes (Addendum)
S.D.W- Instructions   Your procedure is scheduled on Thurs., Nov. 2, 2023 from 7:30AM-9:45AM.  Report to [facility] Main Entrance "A" at 5:30 A.M., then check in with the Admitting office.  Call this number if you have problems the morning of surgery:  [phone]   Remember:  Do not eat or drink after midnight on Nov. 1st    Take these medicines the morning of surgery with A SIP OF WATER: Acetaminophen (TYLENOL) OxyCODONE (ROXICODONE)  As of today, STOP taking any Aspirin (unless otherwise instructed by your surgeon) Aleve, Naproxen, Ibuprofen, Motrin, Advil, Goody's, BC's, all herbal medications, fish oil, and all vitamins.          Do not wear jewelry. Do not wear lotions, powders, cologne or deodorant. Do not shave 48 hours prior to surgery.  Men may shave face and neck. Do not bring valuables to the hospital.  [facility] is not responsible for any belongings or valuables.    Do NOT Smoke (Tobacco/Vaping)  24 hours prior to your procedure  If you use a CPAP at night, you may bring your mask for your overnight stay.   Contacts, glasses, hearing aids, dentures or partials may not be worn into surgery, please bring cases for these belongings   For patients admitted to the hospital, discharge time will be determined by your treatment team.   Patients discharged the day of surgery will not be allowed to drive home, and someone needs to stay with them for 24 hours.  Special instructions:    Oral Hygiene is also important to reduce your risk of infection.  Remember - BRUSH YOUR TEETH THE MORNING OF SURGERY WITH YOUR REGULAR TOOTHPASTE  [facility]- Preparing For Surgery  Before surgery, you can play an important role. Because skin is not sterile, your skin needs to be as free of germs as possible. You can reduce the number of germs on your skin by washing with Antibacterial Soap before surgery.     Please follow these instructions carefully.     Shower the NIGHT BEFORE  SURGERY and the MORNING OF SURGERY with Antibacterial Soap.   Pat yourself dry with a CLEAN TOWEL.  Wear CLEAN PAJAMAS to bed the night before surgery  Place CLEAN SHEETS on your bed the night before your surgery  DO NOT SLEEP WITH PETS.  Day of Surgery:  Take a shower with Antibacterial soap. Wear Clean/Comfortable clothing the morning of surgery Do not apply any deodorants/lotions.   Remember to brush your teeth WITH YOUR REGULAR TOOTHPASTE.   If you test positive for Covid, or been in contact with anyone that has tested positive in the last 10 days, please notify your surgeon.  SURGICAL WAITING ROOM VISITATION Patients having surgery or a procedure may have no more than 2 support people in the waiting area - these visitors may rotate.   Children under the age of 59 must have an adult with them who is not the patient. If the patient needs to stay at the hospital during part of their recovery, the visitor guidelines for inpatient rooms apply. Pre-op nurse will coordinate an appropriate time for 1 support person to accompany patient in pre-op.  This support person may not rotate.   Please refer to the [facility] website for the visitor guidelines for Inpatients (after your surgery is over and you are in a regular room).

## 2022-08-16 NOTE — Progress Notes (Signed)
LVM with voice interpreter Leretha Dykes [phone] wit pre-op instructions for his surgery tomorrow 08/17/22.

## 2022-08-16 NOTE — H&P (Signed)
[facility] ([facility]) H&P   Patient ID: Adrian Hall MRN: [id number] DOB/AGE: 1976-09-09 46 y.o.  Reason for Surgery: Left distal humerus fracture  HPI: Adrian Hall is an 46 y.o. male presenting for surgery on left upper extremity.  Patient fell from 9 foot ladder on 08/12/2022, landing primarily on his left elbow had immediate pain in the left upper extremity.  Was initially seen in urgent care and found to have a left distal humerus fracture.  Subsequently sent to [facility] emergency department for evaluation.  Orthopedics was consulted for evaluation and management.  Patient seen by Dr. Erlinda Hong and placed in a long-arm splint.  Due to complex nature of injury, Dr. Erlinda Hong felt patient would best be managed by an orthopedic traumatologist for definitive fixation.  Patient was discharged home from the emergency department with instructions to follow-up with OTS as an outpatient.  Patient seen in [place] clinic on 08/14/2022. Continues to have pain in the left arm but this has been managed with oxycodone, ibuprofen, Tylenol.  Denies any significant numbness or tingling throughout the left upper extremity.  Other than laceration to forehead, denies any other injuries from his fall.  Denies any previous injury or surgery to the left upper extremity.  Works as a Curator.  Denies any blood thinner use.  No past medical history on file.  No past surgical history on file.  No family history on file.  Social History:  reports that he has never smoked. He has never used smokeless tobacco. No history on file for alcohol use and drug use.  Allergies: Not on File  Medications: I have reviewed the patient's current medications. Prior to Admission:  No medications prior to admission.    ROS: Constitutional: No fever or chills Vision: No changes in vision ENT: No difficulty swallowing CV: No chest pain Pulm: No SOB or wheezing GI: No nausea or vomiting GU: No urgency or inability to hold  urine Skin: No poor wound healing Neurologic: No numbness or tingling Psychiatric: No depression or anxiety Heme: No bruising Allergic: No reaction to medications or food   Exam: There were no vitals taken for this visit. General: No acute distress Orientation: Alert and oriented x4 Mood and Affect: Mood and affect appropriate, pleasant and cooperative Gait: Within normal limits Coordination and balance: Within normal limits  Left upper extremity: Long-arm splint in place.  Nontender above splint.  Able to wiggle fingers.  Endorses sensation about the hand.  Remainder of motor and sensory exam limited due to splint being in place.  Fingers warm and well-perfused  Right upper extremity: Bruising over the upper arm.  No open wounds. No significant tenderness to palpation throughout the extremity. Full ROM and full strength in each muscle group without evidence of instability.  Motor and sensory function intact.  Neurovascularly intact   Medical Decision Making: Data: Imaging: CT scan left elbow shows comminuted, displaced fracture of the distal humerus with intra-articular extension  Labs: No results found for this or any previous visit (from the past 168 hour(s)).   Assessment/Plan: 46 year old male with left distal humerus fracture s/p fall from ladder  Patient with significant injury to left upper extremity that will require surgical intervention. I would recommend proceeding with open reduction internal fixation of the left distal humerus. Risks and benefits of the procedure have been discussed with the patient. Risks discussed included bleeding, infection, malunion, nonunion, damage to surrounding nerves and blood vessels, pain, hardware prominence or irritation, hardware failure, stiffness, post-traumatic  arthritis, DVT/PE, compartment syndrome, and even anesthesia complications.  Patient states understanding of these risks and agrees to proceed with surgery.  Consent will be  obtained.  We will plan to discharge the patient home postoperatively.   Gwinda Passe PA-C [facility] [phone] (office) [email or web address]

## 2022-08-17 ENCOUNTER — Ambulatory Visit (HOSPITAL_COMMUNITY): Payer: Self-pay | Admitting: Certified Registered Nurse Anesthetist

## 2022-08-17 ENCOUNTER — Other Ambulatory Visit: Payer: Self-pay

## 2022-08-17 ENCOUNTER — Ambulatory Visit (HOSPITAL_COMMUNITY): Payer: Self-pay

## 2022-08-17 ENCOUNTER — Ambulatory Visit (HOSPITAL_COMMUNITY)
Admission: RE | Admit: 2022-08-17 | Discharge: 2022-08-17 | Disposition: A | Discharge status: Home / Self Care | Payer: Self-pay | Source: Ambulatory Visit | Attending: Student | Admitting: Student

## 2022-08-17 ENCOUNTER — Ambulatory Visit (HOSPITAL_BASED_OUTPATIENT_CLINIC_OR_DEPARTMENT_OTHER): Payer: Self-pay | Admitting: Certified Registered Nurse Anesthetist

## 2022-08-17 ENCOUNTER — Encounter (HOSPITAL_COMMUNITY): Payer: Self-pay | Admitting: Student

## 2022-08-17 ENCOUNTER — Encounter (HOSPITAL_COMMUNITY)
Admission: RE | Disposition: A | Discharge status: Home / Self Care | Payer: Self-pay | Source: Ambulatory Visit | Attending: Student

## 2022-08-17 DIAGNOSIS — S42402A Unspecified fracture of lower end of left humerus, initial encounter for closed fracture: Secondary | ICD-10-CM | POA: Insufficient documentation

## 2022-08-17 DIAGNOSIS — Z01818 Encounter for other preprocedural examination: Secondary | ICD-10-CM

## 2022-08-17 DIAGNOSIS — S42412A Displaced simple supracondylar fracture without intercondylar fracture of left humerus, initial encounter for closed fracture: Secondary | ICD-10-CM

## 2022-08-17 DIAGNOSIS — S0181XA Laceration without foreign body of other part of head, initial encounter: Secondary | ICD-10-CM | POA: Insufficient documentation

## 2022-08-17 DIAGNOSIS — W11XXXA Fall on and from ladder, initial encounter: Secondary | ICD-10-CM | POA: Insufficient documentation

## 2022-08-17 DIAGNOSIS — X58XXXA Exposure to other specified factors, initial encounter: Secondary | ICD-10-CM | POA: Insufficient documentation

## 2022-08-17 DIAGNOSIS — S52032A Displaced fracture of olecranon process with intraarticular extension of left ulna, initial encounter for closed fracture: Secondary | ICD-10-CM

## 2022-08-17 HISTORY — PX: ORIF HUMERUS FRACTURE: SHX2126

## 2022-08-17 LAB — POCT I-STAT, CHEM 8
BUN: 11 mg/dL (ref 6–20)
Calcium, Ion: 1.21 mmol/L (ref 1.15–1.40)
Chloride: 104 mmol/L (ref 98–111)
Creatinine, Ser: 0.8 mg/dL (ref 0.61–1.24)
Glucose, Bld: 92 mg/dL (ref 70–99)
HCT: 40 % (ref 39.0–52.0)
Hemoglobin: 13.6 g/dL (ref 13.0–17.0)
Potassium: 4 mmol/L (ref 3.5–5.1)
Sodium: 141 mmol/L (ref 135–145)
TCO2: 31 mmol/L (ref 22–32)

## 2022-08-17 SURGERY — OPEN REDUCTION INTERNAL FIXATION (ORIF) DISTAL HUMERUS FRACTURE
Anesthesia: General | Site: Arm Upper | Laterality: Left

## 2022-08-17 MED ORDER — ONDANSETRON HCL 4 MG/2ML IJ SOLN
INTRAMUSCULAR | Status: DC | PRN
  Administered 2022-08-17: 4 mg via INTRAVENOUS

## 2022-08-17 MED ORDER — DEXAMETHASONE SODIUM PHOSPHATE 10 MG/ML IJ SOLN
INTRAMUSCULAR | Status: DC | PRN
  Administered 2022-08-17: 10 mg via INTRAVENOUS

## 2022-08-17 MED ORDER — PROPOFOL 10 MG/ML IV BOLUS
INTRAVENOUS | Status: AC
  Filled 2022-08-17: qty 20

## 2022-08-17 MED ORDER — CEFAZOLIN SODIUM-DEXTROSE 2-4 GM/100ML-% IV SOLN
2.0000 g | INTRAVENOUS | Status: AC
  Administered 2022-08-17: 2 g via INTRAVENOUS
  Filled 2022-08-17: qty 100

## 2022-08-17 MED ORDER — OXYCODONE HCL 5 MG/5ML PO SOLN: 5.0000 mg | Freq: Once | ORAL | Status: AC | PRN

## 2022-08-17 MED ORDER — VANCOMYCIN HCL 1000 MG IV SOLR
INTRAVENOUS | Status: DC | PRN
  Administered 2022-08-17: 1000 mg via TOPICAL

## 2022-08-17 MED ORDER — PROPOFOL 10 MG/ML IV BOLUS
INTRAVENOUS | Status: DC | PRN
  Administered 2022-08-17: 150 mg via INTRAVENOUS
  Administered 2022-08-17: 50 mg via INTRAVENOUS
  Administered 2022-08-17: 100 mg via INTRAVENOUS

## 2022-08-17 MED ORDER — FENTANYL CITRATE (PF) 100 MCG/2ML IJ SOLN: 25.0000 ug | INTRAMUSCULAR | Status: DC | PRN

## 2022-08-17 MED ORDER — MIDAZOLAM HCL 2 MG/2ML IJ SOLN
INTRAMUSCULAR | Status: DC | PRN
  Administered 2022-08-17: 2 mg via INTRAVENOUS

## 2022-08-17 MED ORDER — FENTANYL CITRATE (PF) 100 MCG/2ML IJ SOLN
INTRAMUSCULAR | Status: AC
  Filled 2022-08-17: qty 2

## 2022-08-17 MED ORDER — 0.9 % SODIUM CHLORIDE (POUR BTL) OPTIME
TOPICAL | Status: DC | PRN
  Administered 2022-08-17: 1000 mL

## 2022-08-17 MED ORDER — LACTATED RINGERS IV SOLN: INTRAVENOUS | Status: DC

## 2022-08-17 MED ORDER — METHOCARBAMOL 500 MG PO TABS: 500.0000 mg | ORAL_TABLET | Freq: Four times a day (QID) | ORAL | 0 refills | Status: AC | PRN

## 2022-08-17 MED ORDER — ONDANSETRON HCL 4 MG/2ML IJ SOLN: 4.0000 mg | Freq: Once | INTRAMUSCULAR | Status: DC | PRN

## 2022-08-17 MED ORDER — PHENYLEPHRINE HCL-NACL 20-0.9 MG/250ML-% IV SOLN
INTRAVENOUS | Status: DC | PRN
  Administered 2022-08-17: 30 ug/min via INTRAVENOUS

## 2022-08-17 MED ORDER — VANCOMYCIN HCL 1000 MG IV SOLR
INTRAVENOUS | Status: AC
  Filled 2022-08-17: qty 20

## 2022-08-17 MED ORDER — CEFAZOLIN SODIUM-DEXTROSE 2-4 GM/100ML-% IV SOLN
INTRAVENOUS | Status: AC
  Filled 2022-08-17: qty 100

## 2022-08-17 MED ORDER — CHLORHEXIDINE GLUCONATE 0.12 % MT SOLN
15.0000 mL | Freq: Once | OROMUCOSAL | Status: AC
  Filled 2022-08-17: qty 15

## 2022-08-17 MED ORDER — ORAL CARE MOUTH RINSE: 15.0000 mL | Freq: Once | OROMUCOSAL | Status: AC

## 2022-08-17 MED ORDER — KETOROLAC TROMETHAMINE 30 MG/ML IJ SOLN: 30.0000 mg | Freq: Once | INTRAMUSCULAR | Status: AC | PRN

## 2022-08-17 MED ORDER — SUGAMMADEX SODIUM 200 MG/2ML IV SOLN
INTRAVENOUS | Status: DC | PRN
  Administered 2022-08-17: 200 mg via INTRAVENOUS

## 2022-08-17 MED ORDER — CHLORHEXIDINE GLUCONATE 0.12 % MT SOLN
OROMUCOSAL | Status: AC
  Administered 2022-08-17: 15 mL via OROMUCOSAL
  Filled 2022-08-17: qty 15

## 2022-08-17 MED ORDER — OXYCODONE HCL 5 MG PO TABS
ORAL_TABLET | ORAL | Status: AC
  Filled 2022-08-17: qty 1

## 2022-08-17 MED ORDER — OXYCODONE-ACETAMINOPHEN 5-325 MG PO TABS: 1.0000 | ORAL_TABLET | ORAL | 0 refills | Status: AC | PRN

## 2022-08-17 MED ORDER — FENTANYL CITRATE (PF) 250 MCG/5ML IJ SOLN
INTRAMUSCULAR | Status: DC | PRN
  Administered 2022-08-17: 100 ug via INTRAVENOUS
  Administered 2022-08-17: 50 ug via INTRAVENOUS

## 2022-08-17 MED ORDER — ROCURONIUM BROMIDE 10 MG/ML (PF) SYRINGE
PREFILLED_SYRINGE | INTRAVENOUS | Status: DC | PRN
  Administered 2022-08-17: 70 mg via INTRAVENOUS

## 2022-08-17 MED ORDER — FENTANYL CITRATE (PF) 250 MCG/5ML IJ SOLN
INTRAMUSCULAR | Status: AC
  Filled 2022-08-17: qty 5

## 2022-08-17 MED ORDER — MIDAZOLAM HCL 2 MG/2ML IJ SOLN
INTRAMUSCULAR | Status: AC
  Filled 2022-08-17: qty 2

## 2022-08-17 MED ORDER — BUPIVACAINE LIPOSOME 1.3 % IJ SUSP
INTRAMUSCULAR | Status: DC | PRN
  Administered 2022-08-17: 10 mL via PERINEURAL

## 2022-08-17 MED ORDER — BUPIVACAINE HCL (PF) 0.5 % IJ SOLN
INTRAMUSCULAR | Status: DC | PRN
  Administered 2022-08-17: 20 mL via PERINEURAL

## 2022-08-17 MED ORDER — OXYCODONE HCL 5 MG PO TABS
5.0000 mg | ORAL_TABLET | Freq: Once | ORAL | Status: AC | PRN
  Administered 2022-08-17: 5 mg via ORAL

## 2022-08-17 SURGICAL SUPPLY — 88 items
BAG COUNTER SPONGE SURGICOUNT (BAG) ×1 IMPLANT
BIT DRILL 3.2 QUICK MINI 300 (DRILL) IMPLANT
BIT DRILL 5.0 QC 6.5 (BIT) IMPLANT
BIT DRILL QC 2.0 SHORT EVOS SM (DRILL) IMPLANT
BIT DRILL QC 2.5MM SHRT EVO SM (DRILL) IMPLANT
BLADE AVERAGE 25X9 (BLADE) IMPLANT
BNDG COHESIVE 4X5 TAN STRL (GAUZE/BANDAGES/DRESSINGS) ×1 IMPLANT
BNDG ELASTIC 3X5.8 VLCR STR LF (GAUZE/BANDAGES/DRESSINGS) IMPLANT
BNDG ELASTIC 4X5.8 VLCR STR LF (GAUZE/BANDAGES/DRESSINGS) IMPLANT
BNDG ESMARK 4X9 LF (GAUZE/BANDAGES/DRESSINGS) IMPLANT
BNDG GAUZE DERMACEA FLUFF 4 (GAUZE/BANDAGES/DRESSINGS) ×2 IMPLANT
BRUSH SCRUB EZ PLAIN DRY (MISCELLANEOUS) ×2 IMPLANT
CLEANER TIP ELECTROSURG 2X2 (MISCELLANEOUS) ×1 IMPLANT
CORD BIPOLAR FORCEPS 12FT (ELECTRODE) ×1 IMPLANT
COVER SURGICAL LIGHT HANDLE (MISCELLANEOUS) ×1 IMPLANT
DRAIN PENROSE 1/4X12 LTX STRL (WOUND CARE) IMPLANT
DRAPE C-ARM 42X72 X-RAY (DRAPES) ×1 IMPLANT
DRAPE C-ARMOR (DRAPES) IMPLANT
DRAPE HALF SHEET 40X57 (DRAPES) ×1 IMPLANT
DRAPE INCISE IOBAN 66X45 STRL (DRAPES) IMPLANT
DRAPE ORTHO SPLIT 77X108 STRL (DRAPES) ×1
DRAPE SURG ORHT 6 SPLT 77X108 (DRAPES) ×1 IMPLANT
DRAPE U-SHAPE 47X51 STRL (DRAPES) ×1 IMPLANT
DRILL QC 2.0 SHORT EVOS SM (DRILL) ×1
DRILL QC 2.5MM SHORT EVOS SM (DRILL) ×2
DRSG ADAPTIC 3X8 NADH LF (GAUZE/BANDAGES/DRESSINGS) ×1 IMPLANT
DRSG MEPILEX POST OP 4X12 (GAUZE/BANDAGES/DRESSINGS) IMPLANT
ELECT REM PT RETURN 9FT ADLT (ELECTROSURGICAL) ×1
ELECTRODE REM PT RTRN 9FT ADLT (ELECTROSURGICAL) ×1 IMPLANT
EVACUATOR 1/8 PVC DRAIN (DRAIN) IMPLANT
GAUZE PAD ABD 8X10 STRL (GAUZE/BANDAGES/DRESSINGS) ×1 IMPLANT
GAUZE SPONGE 4X4 12PLY STRL (GAUZE/BANDAGES/DRESSINGS) ×2 IMPLANT
GLOVE BIO SURGEON STRL SZ 6.5 (GLOVE) ×3 IMPLANT
GLOVE BIO SURGEON STRL SZ7.5 (GLOVE) ×3 IMPLANT
GLOVE BIOGEL PI IND STRL 6.5 (GLOVE) ×1 IMPLANT
GLOVE BIOGEL PI IND STRL 7.5 (GLOVE) ×1 IMPLANT
GOWN STRL REUS W/ TWL LRG LVL3 (GOWN DISPOSABLE) ×3 IMPLANT
GOWN STRL REUS W/ TWL XL LVL3 (GOWN DISPOSABLE) ×1 IMPLANT
GOWN STRL REUS W/TWL LRG LVL3 (GOWN DISPOSABLE) ×3
GOWN STRL REUS W/TWL XL LVL3 (GOWN DISPOSABLE) ×1
K-WIRE 1.6 (WIRE) ×4
K-WIRE FX150X1.6XTROC PNT (WIRE) ×4
KIT BASIN OR (CUSTOM PROCEDURE TRAY) ×1 IMPLANT
KIT TURNOVER KIT B (KITS) ×1 IMPLANT
KWIRE FX150X1.6XTROC PNT (WIRE) IMPLANT
LOOP VESSEL MAXI BLUE (MISCELLANEOUS) ×1 IMPLANT
MANIFOLD NEPTUNE II (INSTRUMENTS) ×1 IMPLANT
NDL HYPO 25X1 1.5 SAFETY (NEEDLE) IMPLANT
NEEDLE HYPO 25X1 1.5 SAFETY (NEEDLE) IMPLANT
NS IRRIG 1000ML POUR BTL (IV SOLUTION) ×1 IMPLANT
PACK ORTHO EXTREMITY (CUSTOM PROCEDURE TRAY) ×1 IMPLANT
PAD ARMBOARD 7.5X6 YLW CONV (MISCELLANEOUS) ×2 IMPLANT
PAD CAST 4YDX4 CTTN HI CHSV (CAST SUPPLIES) IMPLANT
PADDING CAST COTTON 4X4 STRL (CAST SUPPLIES) ×4
PLATE HUM EVOS 5H L 2.7X102 (Plate) IMPLANT
PLATE HUM EVOS 6H L 2.7X85 (Plate) IMPLANT
SCREW CANN PT 6.5X115 (Screw) IMPLANT
SCREW CORT 3.5X22 ST EVOS (Screw) IMPLANT
SCREW CORT ST EVOS 2.7X46 (Screw) IMPLANT
SCREW CORTEX 3.5X18 EVOS (Screw) IMPLANT
SCREW CORTEX 3.5X24MM (Screw) IMPLANT
SCREW CORTEX 3.5X26 (Screw) IMPLANT
SCREW LOCK EVOS 2.7X32 (Screw) IMPLANT
SCREW LOCK EVOS 2.7X44 (Screw) IMPLANT
SCREW LOCK ST EVOS 2.7X20 (Screw) IMPLANT
SCREW LOCK ST EVOS 2.7X22 (Screw) IMPLANT
SCREW LOCK ST EVOS 2.7X70 (Screw) IMPLANT
SPONGE T-LAP 18X18 ~~LOC~~+RFID (SPONGE) IMPLANT
STAPLER VISISTAT 35W (STAPLE) IMPLANT
STOCKINETTE IMPERVIOUS 9X36 MD (GAUZE/BANDAGES/DRESSINGS) ×1 IMPLANT
SUCTION FRAZIER HANDLE 10FR (MISCELLANEOUS) ×1
SUCTION FRAZIER TIP 8 FR DISP (SUCTIONS) ×1
SUCTION TUBE FRAZIER 10FR DISP (MISCELLANEOUS) ×1 IMPLANT
SUCTION TUBE FRAZIER 8FR DISP (SUCTIONS) IMPLANT
SUT ETHILON 3 0 PS 1 (SUTURE) ×2 IMPLANT
SUT VIC AB 0 CT1 27 (SUTURE) ×2
SUT VIC AB 0 CT1 27XBRD ANBCTR (SUTURE) ×2 IMPLANT
SUT VIC AB 2-0 CT1 27 (SUTURE) ×2
SUT VIC AB 2-0 CT1 TAPERPNT 27 (SUTURE) ×2 IMPLANT
SYR 5ML LL (SYRINGE) IMPLANT
SYR CONTROL 10ML LL (SYRINGE) ×1 IMPLANT
TOWEL GREEN STERILE (TOWEL DISPOSABLE) ×1 IMPLANT
TOWEL GREEN STERILE FF (TOWEL DISPOSABLE) ×1 IMPLANT
TRAY FOLEY MTR SLVR 16FR STAT (SET/KITS/TRAYS/PACK) IMPLANT
TUBE CONNECTING 12X1/4 (SUCTIONS) IMPLANT
WASHER CANN 12.7 STRL (Washer) IMPLANT
WATER STERILE IRR 1000ML POUR (IV SOLUTION) ×1 IMPLANT
YANKAUER SUCT BULB TIP NO VENT (SUCTIONS) IMPLANT

## 2022-08-17 NOTE — Anesthesia Postprocedure Evaluation (Signed)
Anesthesia Post Note  Patient: Adrian Hall  Procedure(s) Performed: OPEN REDUCTION INTERNAL FIXATION (ORIF) DISTAL HUMERUS FRACTURE (Left: Arm Upper)     Patient location during evaluation: PACU Anesthesia Type: General Level of consciousness: awake and alert Pain management: pain level controlled Vital Signs Assessment: post-procedure vital signs reviewed and stable Respiratory status: spontaneous breathing, nonlabored ventilation, respiratory function stable and patient connected to nasal cannula oxygen Cardiovascular status: blood pressure returned to baseline and stable Postop Assessment: no apparent nausea or vomiting Anesthetic complications: no  No notable events documented.  Last Vitals:  Vitals:   08/17/22 1058 08/17/22 1113  BP: (!) 150/89 (!) 162/82  Pulse: 82 79  Resp: 18 16  Temp:  (!) 36.4 C  SpO2: 97% 97%    Last Pain:  Vitals:   08/17/22 1058  TempSrc:   PainSc: 4                  Nameer Summer S

## 2022-08-17 NOTE — Op Note (Signed)
Orthopaedic Surgery Operative Note (CSN: [id number] ) Date of Surgery: 08/17/2022  Admit Date: 08/17/2022   Diagnoses: Pre-Op Diagnoses: Left supracondylar distal humerus fracture  Post-Op Diagnosis: Same  Procedures: CPT 24586-Open reduction internal fixation of left distal humerus fracture CPT 25360-Ulnar osteotomy  Surgeons : Primary: Roby Lofts, MD  Assistant: Ulyses Southward, PA-C  Location: OR 3   Anesthesia:General with regional block   Antibiotics: Ancef 2g preop with 1 gm vancomycin powder placed topically   Tourniquet time: None    Estimated Blood Loss: 150 mL  Complications:None   Specimens:None  Implants: Implant Name Type Inv. Item Serial No. Manufacturer Lot No. LRB No. Used Action  WASHER CANN 12.7 STRL - [id number] Washer WASHER CANN 12.7 STRL  [employer]  Left 1 Implanted  6.5 X 115 PT Screw    [employer]  Left 1 Implanted  PLATE HUM EVOS 6H L 2.7X85 - [id number] Plate PLATE HUM EVOS 6H L 2.7X85  [employer]  Left 1 Implanted  SCREW CORTEX 3.5X18 EVOS - [id number] Screw SCREW CORTEX 3.5X18 EVOS  [employer]  Left 2 Implanted  SCREW CORTEX 3.5X18 EVOS - [id number] Screw SCREW CORTEX 3.5X18 EVOS  [employer]  Left 1 Implanted  SCREW CORT ST EVOS 2.7X46 - [id number] Screw SCREW CORT ST EVOS 2.7X46  [employer]  Left 1 Implanted  SCREW CORTEX 3.5X24MM - [id number] Screw SCREW CORTEX 3.5X24MM  [employer]  Left 2 Implanted  SCREW LOCK ST EVOS 2.7X22 - [id number] Screw SCREW LOCK ST EVOS 2.7X22  [employer]  Left 1 Implanted  SCREW LOCK ST EVOS 2.7X20 - [id number] Screw SCREW LOCK ST EVOS 2.7X20  [employer]  Left 1 Implanted  PLATE HUM EVOS 5H L 2.7X102 - [id number] Plate PLATE HUM EVOS 5H L 2.7X102  [employer]  Left 1 Implanted  SCREW CORT 3.5X22 ST EVOS - [id number] Screw SCREW CORT  3.5X22 ST EVOS  [employer]  Left 2 Implanted  SCREW LOCK ST EVOS 2.7X70 - [id number] Screw SCREW LOCK ST EVOS 2.7X70  [employer]  Left 1 Implanted  [place] EVOS 2.7X44 - [id number] Screw SCREW LOCK EVOS 2.7X44  [employer]  Left 1 Implanted  SCREW LOCK EVOS 2.7X32 - [id number] Screw SCREW LOCK EVOS 2.7X32  [employer]  Left 1 Implanted  SCREW CORTEX 3.5X26 - [id number] Screw SCREW CORTEX 3.5X26  [employer]  Left 1 Implanted     Indications for Surgery: 46 year old male who fell from a ladder sustaining a left intra-articular distal humerus fracture.  Due to the unstable nature of his injury I recommend proceeding with open reduction internal fixation.  Risks and benefits were discussed with the patient.  Risks include but not limited to bleeding, infection, malunion, nonunion, hardware failure, hardware irritation, nerve or blood vessel injury, DVT, elbow stiffness, posttraumatic arthritis, even the possibility anesthetic complications.  He agreed to proceed with surgery and consent was obtained.  Operative Findings: 1.  Open reduction internal fixation of intra-articular distal humerus fracture using [employer] EVOS posterior lateral and direct medial distal humeral locking plates. 2.  Ulnar osteotomy for visualization of the intra-articular fracture treated and fixed with a [employer] 6.5 mm partially-threaded cannulated screw with a washer.  Procedure: The patient was identified in the preoperative holding area. Consent was confirmed with the patient and  their family and all questions were answered. The operative extremity was marked after confirmation with the patient. he was then brought back to the operating room by our anesthesia colleagues.  He is carefully transferred over to radiolucent flat top table.  He was placed under general anesthetic.  He was then placed in the lateral decubitus  position with his left side up.  An axillary roll was placed to keep pressure off of his neurovascular structures.  All bony prominences were well-padded.  The left upper extremity was then prepped and draped in usual sterile fashion.  A timeout was performed to verify the patient, the procedure, and the extremity.  Preoperative antibiotics were dosed.  Fluoroscopic imaging showed the unstable nature of his injury.  A direct posterior approach to the elbow was made and carried down through skin and subcutaneous tissue.  I incised through the triceps fascia and then developed the para tricipital planes with developing a lateral first.  I incised through the anconeus to visualize the articular surface as well as the posterior aspect of the capitellum.  I then released the triceps muscle off the posterior aspect of the distal humeral shaft.  I then went to the medial side and exposed and dissected out the ulnar nerve.  I took care to protect it throughout the case.  I placed a vessel loop around this to help mobilize it.  I dissected this all the way up to the intermuscular septum and released the triceps off the posterior aspect of the humerus.  I then released the capsule to visualize the intra-articular trochlea.  At this point I then drilled and passed a guidewire for the 6.5 mm cannulated screw under fluoroscopic imaging down the center of the ulna.  I then placed a 6.5 mm partially-threaded cannulated screw to create a path and cut screw threads through the bone for placement of fixation after the ulnar osteotomy.  I then removed the screw and then used a ACL saw to create a osteotomy to visualize the articular surface.  I completed the osteotomy with a osteotome to keep the articular surface intact of the olecranon.  I was then able to flip the triceps and the olecranon up to visualize the intra-articular fracture.  At this point I turned my attention to fixation of the intra-articular split.  There is a  single split that I was able to reduce relatively anatomically.  The trochlea was disengaged from the medial epicondyle which made reduction a little bit difficult.  I was able to clamp the reduced anatomically held provisionally with a 1.6 mm K wire from medial to lateral.  I then failed a cortical read to conduct the metaphysis of the medial epicondyle to the humeral shaft and clamped this in place.  Also was able to clamp the lateral condyle of the capitellum to the metaphysis of the humeral shaft.  There was some extension deformity through the fracture that I was able to correct with the plate.  I placed this in place and held the reduction provisionally with 1.6 mm K wires from the epicondyles to the humeral shaft.  I then positioned a [employer] EVOS posterior lateral distal humerus plate held it provisionally distally to the capitellum and then I drilled and placed a nonlocking screw into the shaft.  This was able to correct the extension deformity through the metaphyseal fracture.  I confirmed this with fluoroscopy.  I then placed another nonlocking screw into the humeral shaft.  I then placed  a 2.7 mm positional screw from medial to lateral to hold the intra-articular split of the trochlea.  At this point I then placed locking screws into the capitellum using the posterior lateral plate.  I removed the K wires laterally.  I then contoured a [employer] EVOS direct medial distal humeral locking plate and positioned this appropriately and held it in place with 1.6 mm K wire.  I then placed nonlocking screws into the humeral shaft.  I then returned distally and placed locking screws across the medial condyle it across the intra-articular split into the lateral condyle.  Completed the construct by placing a few more nonlocking screws into the humeral shaft.  All K wires were removed all clamps were removed and final fluoroscopic imaging was obtained.  The incision was copiously irrigated and a gram  of vancomycin powder was placed into the incision.  I then reduced the olecranon osteotomy back in place placed a guidewire in the center of the canal and then placed a 6.5 mm cannulated screw with a washer to compress the osteotomy site.  Excellent fixation was obtained.  Fluoroscopic imaging was obtained.  The anconeus was closed with 0 Vicryl suture.  There is no subluxation of the ulnar nerve so I left the para tricipital windows without any closure.  I closed the triceps fascia with interrupted 0 Vicryl suture.  The skin was closed with 2-0 Vicryl and 3-0 Monocryl.  Sterile dressing was applied.  The patient was then awoken from anesthesia and taken to the PACU in stable condition.  Post Op Plan/Instructions: Patient will be nonweightbearing to the left upper extremity.  He will discharge home from the PACU.  No DVT prophylaxis needed in this ambulatory upper extremity patient.  He will have unrestricted range of motion of the left elbow.  I was present and performed the entire surgery.  Patrecia Pace, PA-C did assist me throughout the case. An assistant was necessary given the difficulty in approach, maintenance of reduction and ability to instrument the fracture.   Katha Hamming, MD [facility]

## 2022-08-17 NOTE — Anesthesia Procedure Notes (Signed)
Procedure Name: Intubation Date/Time: 08/17/2022 8:03 AM  Performed by: Darletta Moll, CRNAPre-anesthesia Checklist: Patient identified, Emergency Drugs available, Suction available and Patient being monitored Patient Re-evaluated:Patient Re-evaluated prior to induction Oxygen Delivery Method: Circle system utilized Preoxygenation: Pre-oxygenation with 100% oxygen Induction Type: IV induction Ventilation: Mask ventilation without difficulty and Oral airway inserted - appropriate to patient size Laryngoscope Size: Mac and 4 Grade View: Grade II Tube type: Oral Tube size: 7.5 mm Number of attempts: 1 Airway Equipment and Method: Stylet and Oral airway Placement Confirmation: ETT inserted through vocal cords under direct vision, positive ETCO2 and breath sounds checked- equal and bilateral Secured at: 23 cm Tube secured with: Tape Dental Injury: Teeth and Oropharynx as per pre-operative assessment

## 2022-08-17 NOTE — Transfer of Care (Signed)
Immediate Anesthesia Transfer of Care Note  Patient: Adrian Hall  Procedure(s) Performed: OPEN REDUCTION INTERNAL FIXATION (ORIF) DISTAL HUMERUS FRACTURE (Left: Arm Upper)  Patient Location: PACU  Anesthesia Type:General and Regional  Level of Consciousness: awake, alert , oriented, and patient cooperative  Airway & Oxygen Therapy: Patient Spontanous Breathing and Patient connected to nasal cannula oxygen  Post-op Assessment: Report given to RN and Post -op Vital signs reviewed and stable  Post vital signs: Reviewed and stable  Last Vitals:  Vitals Value Taken Time  BP 141/78 08/17/22 1043  Temp 36.3 C 08/17/22 1043  Pulse 92 08/17/22 1044  Resp 22 08/17/22 1044  SpO2 99 % 08/17/22 1044  Vitals shown include unvalidated device data.  Last Pain:  Vitals:   08/17/22 0721  TempSrc:   PainSc: 0-No pain         Complications: No notable events documented.

## 2022-08-17 NOTE — Discharge Instructions (Addendum)
Katha Hamming, MD Patrecia Pace PA-C [facility] [place] [phone] Asher [phone] (fax)                                  POST-OPERATIVE INSTRUCTIONS   WEIGHT BEARING STATUS: Non-weightbearing left upper extremity  RANGE OF MOTION/ACTIVITY:  ok for shoulder and elbow range of motion as tolerated  WOUND CARE You may remove your dressings on Post-Op Day #2 (Saturday 08/19/22). If there is no drainage, you may leave incisions open to air. If drainage continues, we recommend continuing with dressing changes using Adaptic, 4x4 gauze, kerlix and an ace wrap You may shower on Post-Op Day #3 (Sunday, 08/20/22). Clean incisions gently with soap and water  EXERCISES Due to your splint being in place you will not be able to bear weight through your extremity.   Please use your sling until follow-up. Please continue to work on range of motion of your fingers and stretch these multiple times a day to prevent stiffness. Please continue to ambulate and do not stay sitting or lying for too long. Perform foot and wrist pumps to assist in circulation.  DVT/PE prophylaxis: None  DIET: As you were eating previously.  Can use over the counter stool softeners and bowel preparations, such as Miralax, to help with bowel movements.  Narcotics can be constipating.  Be sure to drink plenty of fluids  REGIONAL ANESTHESIA (NERVE BLOCKS) The anesthesia team may have performed a nerve block for you if safe in the setting of your care.  This is a great tool used to minimize pain.  Typically the block may start wearing off overnight but the long acting medicine may last for 3-4 days.  The nerve block wearing off can be a challenging period but please utilize your as needed pain medications to try and manage this period.    ICE AND ELEVATE INJURED/OPERATIVE EXTREMITY Using ice and elevating the injured extremity above your heart can help with swelling and pain control.  Icing in a  pulsatile fashion, such as 20 minutes on and 20 minutes off, can be followed.   Do not place ice directly on skin. Make sure there is a barrier between to skin and the ice pack.   Using frozen items such as frozen peas works well as the conform nicely to the are that needs to be iced.  POST-OP MEDICATIONS- Multimodal approach to pain control In general your pain will be controlled with a combination of substances.  Prescriptions unless otherwise discussed are electronically sent to your pharmacy.  This is a carefully made plan we use to minimize narcotic use.                     -   Acetaminophen - Non-narcotic pain medicine taken on a scheduled basis        -   Oxycodone - This is a strong narcotic, to be used only on an "as needed" basis for pain.                  -   Robaxin -  Muscle relaxer taken on an "as needed" basis for muscle spasms                            STOP SMOKING OR USING NICOTINE PRODUCTS!!!!  As discussed nicotine severely impairs your  body's ability to heal surgical and traumatic wounds but also impairs bone healing.  Wounds and bone heal by forming microscopic blood vessels (angiogenesis) and nicotine is a vasoconstrictor (essentially, shrinks blood vessels).  Therefore, if vasoconstriction occurs to these microscopic blood vessels they essentially disappear and are unable to deliver necessary nutrients to the healing tissue.  This is one modifiable factor that you can do to dramatically increase your chances of healing your injury.    (This means no smoking, no nicotine gum, patches, etc)   FOLLOW-UP If you develop a Fever (>101.5), Redness or Drainage from the surgical incision site, please call our office to arrange for an evaluation. Please call the office to schedule a follow-up appointment for your incision check if you do not already have one, 7-10 days post-operatively.  CALL THE OFFICE WITH ANY QUESTIONS OR CONCERNS: [phone]   VISIT OUR WEBSITE FOR ADDITIONAL  INFORMATION: [email or web address]    OTHER HELPFUL INFORMATION  If you had a block, it will wear off between 8-24 hrs postop typically.  This is period when your pain may go from nearly zero to the pain you would have had postop without the block.  This is an abrupt transition but nothing dangerous is happening.  You may take an extra dose of narcotic when this happens.  You may be more comfortable sleeping in a semi-seated position the first few nights following surgery.  Keep a pillow propped under the elbow and forearm for comfort.  If you have a recliner type of chair it might be beneficial.  If not that is fine too, but it would be helpful to sleep propped up with pillows behind your operated shoulder as well under your elbow and forearm.  This will reduce pulling on the suture lines.  When dressing, put your operative arm in the sleeve first.  When getting undressed, take your operative arm out last.  Loose fitting, button-down shirts are recommended.  Often in the first days after surgery you may be more comfortable keeping your operative arm under your shirt and not through the sleeve.  You may return to work/school in the next couple of days when you feel up to it.  Desk work and typing in the sling is fine.  We suggest you use the pain medication the first night prior to going to bed, in order to ease any pain when the anesthesia wears off. You should avoid taking pain medications on an empty stomach as it will make you nauseous.  You should wean off your narcotic medicines as soon as you are able.  Most patients will be off or using minimal narcotics before their first postop appointment.   Do not drink alcoholic beverages or take illicit drugs when taking pain medications.  It is against the law to drive while taking narcotics.  In some states it is against the law to drive while your arm is in a sling.   Pain medication may make you constipated.  Below are a few solutions to try in  this order: Decrease the amount of pain medication if you aren't having pain. Drink lots of decaffeinated fluids. Drink prune juice and/or each dried prunes  If the first 3 don't work start with additional solutions -   Take Colace - an over-the-counter stool softener -   Take Senokot - an over-the-counter laxative -   Take Miralax - a stronger over-the-counter laxative

## 2022-08-17 NOTE — Anesthesia Procedure Notes (Signed)
Anesthesia Procedure Image    

## 2022-08-17 NOTE — Interval H&P Note (Signed)
History and Physical Interval Note:  08/17/2022 7:22 AM  Adrian Hall  has presented today for surgery, with the diagnosis of left distal humerus fracture.  The various methods of treatment have been discussed with the patient and family. After consideration of risks, benefits and other options for treatment, the patient has consented to  Procedure(s): OPEN REDUCTION INTERNAL FIXATION (ORIF) DISTAL HUMERUS FRACTURE (Left) as a surgical intervention.  The patient's history has been reviewed, patient examined, no change in status, stable for surgery.  I have reviewed the patient's chart and labs.  Questions were answered to the patient's satisfaction.     Lennette Bihari P Kambra Beachem

## 2022-08-17 NOTE — Anesthesia Preprocedure Evaluation (Signed)
Anesthesia Evaluation  Patient identified by MRN, date of birth, ID band Patient awake    Reviewed: Allergy & Precautions, H&P , NPO status , Patient's Chart, lab work & pertinent test results  Airway Mallampati: II  TM Distance: >3 FB Neck ROM: Full    Dental no notable dental hx.    Pulmonary neg pulmonary ROS   Pulmonary exam normal breath sounds clear to auscultation       Cardiovascular negative cardio ROS Normal cardiovascular exam Rhythm:Regular Rate:Normal     Neuro/Psych negative neurological ROS  negative psych ROS   GI/Hepatic negative GI ROS, Neg liver ROS,,,  Endo/Other  negative endocrine ROS    Renal/GU negative Renal ROS  negative genitourinary   Musculoskeletal negative musculoskeletal ROS (+)    Abdominal   Peds negative pediatric ROS (+)  Hematology negative hematology ROS (+)   Anesthesia Other Findings   Reproductive/Obstetrics negative OB ROS                             Anesthesia Physical Anesthesia Plan  ASA: 1  Anesthesia Plan: General   Post-op Pain Management: Regional block*   Induction: Intravenous  PONV Risk Score and Plan: 2 and Ondansetron and Dexamethasone  Airway Management Planned: Oral ETT  Additional Equipment:   Intra-op Plan:   Post-operative Plan: Extubation in OR  Informed Consent: I have reviewed the patients History and Physical, chart, labs and discussed the procedure including the risks, benefits and alternatives for the proposed anesthesia with the patient or authorized representative who has indicated his/her understanding and acceptance.     Dental advisory given  Plan Discussed with: CRNA and Surgeon  Anesthesia Plan Comments:        Anesthesia Quick Evaluation

## 2022-08-17 NOTE — Anesthesia Procedure Notes (Addendum)
Anesthesia Regional Block: Interscalene brachial plexus block   Pre-Anesthetic Checklist: , timeout performed,  Correct Patient, Correct Site, Correct Laterality,  Correct Procedure, Correct Position, site marked,  Risks and benefits discussed,  Surgical consent,  Pre-op evaluation,  At surgeon's request and post-op pain management  Laterality: Left  Prep: chloraprep       Needles:  Injection technique: Single-shot  Needle Type: Echogenic Needle     Needle Length: 9cm      Additional Needles:   Procedures:,,,, ultrasound used (permanent image in chart),,    Narrative:  Start time: 08/17/2022 7:19 AM End time: 08/17/2022 7:29 AM Injection made incrementally with aspirations every 5 mL.  Performed by: Personally  Anesthesiologist: Myrtie Soman, MD  Additional Notes: Patient tolerated the procedure well without complications

## 2022-08-21 ENCOUNTER — Encounter (HOSPITAL_COMMUNITY): Payer: Self-pay | Admitting: Student

## 2022-08-30 ENCOUNTER — Telehealth: Payer: Self-pay | Admitting: Physical Therapy

## 2022-09-04 NOTE — Therapy (Signed)
OUTPATIENT PHYSICAL THERAPY SHOULDER EVALUATION   Patient Name: Adrian Hall MRN: [id number] DOB:[birth year], 46 y.o., male Today's Date: 09/05/2022  END OF SESSION:  PT End of Session - 09/05/22 0848     Visit Number 1    Number of Visits 17    Date for PT Re-Evaluation 10/31/22    Authorization Type no coverage in chart    PT Start Time 0850   pt late arrival   PT Stop Time 0927    PT Time Calculation (min) 37 min    Activity Tolerance Patient tolerated treatment well;No increased pain    Behavior During Therapy [facility] for tasks assessed/performed             History reviewed. No pertinent past medical history. Past Surgical History:  Procedure Laterality Date   ORIF HUMERUS FRACTURE Left 08/17/2022   Procedure: OPEN REDUCTION INTERNAL FIXATION (ORIF) DISTAL HUMERUS FRACTURE;  Surgeon: Roby Lofts, MD;  Location: [facility] OR;  Service: Orthopedics;  Laterality: Left;   Patient Active Problem List   Diagnosis Date Noted   Closed fracture of left distal humerus     PCP: none in chart  REFERRING PROVIDER: Roby Lofts MD  REFERRING DIAG: L distal humerus fracture   THERAPY DIAG:  Pain in left elbow  Stiffness of left elbow, not elsewhere classified  Muscle weakness (generalized)  Abnormal posture  Stiffness of left shoulder, not elsewhere classified  Rationale for Evaluation and Treatment: Rehabilitation  ONSET DATE: 08/17/22  SUBJECTIVE:                                                                                                                                                                                      SUBJECTIVE STATEMENT: Use of video interpreter services throughout session, appreciate their assistance.  Pt fell from a ladder and immediately went to hospital, was taken by brother. States he had a laceration to head that appears to be healing well. States he was told to try and not to move his arm too much, is NWB LUE. Arrives in sling. Pt  states he initially had significant pain after surgery but has improved with time and medication. Since surgery pt has had pain in elbow and wrist. Pt states he has difficulty with dressing, has improved since surgery. Is R hand dominant, is having to modify activities to rely on R hand. Is receiving assistance from his brother. Pt also notes that his R shoulder has been bothering him since fall.   PERTINENT HISTORY: L humeral ORIF 08/17/22  PAIN:  Are you having pain: yes, 5/10 Location: L elbow and wrist, mild numbness/tingling dorsal thumb How would you describe your  pain? Difficulty describing Best in past week: 0/10 Worst in past week: 8/10 Aggravating factors: use of LUE, sleeping, prolonged positioning Easing factors: medication, rest, sling  PRECAUTIONS: S/p L humeral ORIF 08/17/22, NWB LUE with unrestricted ROM of elbow  WEIGHT BEARING RESTRICTIONS: Yes NWB LUE  FALLS:  Has patient fallen in last 6 months? Yes. Number of falls 1 fall, precipitating incident  LIVING ENVIRONMENT: Lives with brother, 1 level home.    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  PLOF: Independent  PATIENT GOALS:move arm better, return   NEXT MD VISIT: 5th December 2023  OBJECTIVE:   DIAGNOSTIC FINDINGS:  S/p ORIF L humerus, ROM unrestricted, NWB  PATIENT SURVEYS:  FOTO deferred given time constraints and use of interpreter services, will plan to assess next visit as able  COGNITION: Overall cognitive status: Within functional limits for tasks assessed     SENSATION: Light touch intact B UE aside from diminished on anterior/dorsal aspect of LUE distal to surgical site  POSTURE/OBSERVATION: Guarded posture, sling use, increased L UT elevation Incision on posterior aspect of UE unremarkable, appears clean and intact Mild bruising just proximal to anterior aspect of elbow; mild swelling in fingers but not outside normal limits given procedure and sling  use  UPPER EXTREMITY ROM:   ROM Right eval AROM/PROM Left eval  Shoulder flexion 148 p and significant UT compensation Not formally tested (see comment)  Shoulder extension    Shoulder abduction    Shoulder adduction    Shoulder internal rotation    Shoulder external rotation    Elbow flexion WNL 85/NT  Elbow extension WNL -35/-30  Wrist flexion    Wrist extension    Wrist ulnar deviation    Wrist radial deviation    Wrist pronation  70/NT  Wrist supination  60/NT   (Blank rows = not tested) Comments: able to make fist on L although limited d/t mild swelling (as expected post operatively with sling use), full finger extension. Serial opposition on L WNL for ROM. Shoulder ROM NT on L due to symptom irritability but pt able to achieve ~80 deg flexion at least with functional observation  UPPER EXTREMITY MMT:  MMT Right eval Left eval  Shoulder flexion    Shoulder extension    Shoulder abduction    Shoulder adduction    Shoulder internal rotation    Shoulder external rotation    Middle trapezius    Lower trapezius    Elbow flexion    Elbow extension    Wrist flexion    Wrist extension    Wrist ulnar deviation    Wrist radial deviation    Wrist pronation    Wrist supination    Grip strength (lbs)    (Blank rows = not tested) Comments: MMT deferred on eval given acuity of surgery  JOINT MOBILITY TESTING:  Deferred given acuity of surgery  PALPATION:  No overt muscular tenderness   TODAY'S TREATMENT:  [facility] Adult PT Treatment:                                                DATE: 09/05/22 Therapeutic Exercise: Active assisted elbow flex/ext x10 within tolerable ROM Active assisted supination/pronation x10 within tolerable ROM    PATIENT EDUCATION: Education details: Pt education on PT impairments, prognosis, and POC. Informed  consent. Rationale for interventions, safe/appropriate HEP performance Person educated: Patient Education method: Explanation, Demonstration, Tactile cues, Verbal cues, and Handouts Education comprehension: verbalized understanding, returned demonstration, verbal cues required, tactile cues required, and needs further education    HOME EXERCISE PROGRAM: Access Code: [email or web address] URL: [email or web address] Date: 09/05/2022 Prepared by: Fransisco Hertz  Exercises - Seated Elbow Flexion AAROM  - 1 x daily - 7 x weekly - 3 sets - 10 reps - Seated Forearm Pronation and Supination AROM  - 1 x daily - 7 x weekly - 3 sets - 10 reps  ASSESSMENT:  CLINICAL IMPRESSION: Patient is a 46 y.o. gentleman who was seen today for physical therapy evaluation and treatment for L humeral fracture s/p ORIF on 08/17/22. Per op note, pt to be NWB LUE with unrestricted ROM of elbow. Pt arrives with sling and impairments as expected postoperatively, limitations in L shoulder/elbow mobility. MMT deferred given acuity of surgery. Pt reports increased difficulty w/ daily activities and having to make modifications for activity to accommodate pain/precautions, is currently out of work (was working as Education administrator). Pt denies any increase in resting pain with HEP, emphasis on comfortable AAROM to maximize safe mobility. Handout provided in Spanish and verbally reviewed with pt. Departs with 5/10 pain on NPS (also 5/10 on arrival), no adverse events. Would benefit from skilled PT to maximize functional UE use for return to PLOF.   OBJECTIVE IMPAIRMENTS: decreased activity tolerance, decreased endurance, decreased mobility, decreased ROM, decreased strength, hypomobility, increased edema, impaired UE functional use, postural dysfunction, and pain.   ACTIVITY LIMITATIONS: carrying, lifting, sleeping, transfers, bathing, toileting, dressing, reach over head, and hygiene/grooming  PARTICIPATION LIMITATIONS: meal prep,  cleaning, laundry, driving, community activity, and occupation  PERSONAL FACTORS: Profession are also affecting patient's functional outcome.   REHAB POTENTIAL: Good  CLINICAL DECISION MAKING: Stable/uncomplicated  EVALUATION COMPLEXITY: Low   GOALS: Goals reviewed with patient? No  SHORT TERM GOALS: Target date: 10/02/2022    Pt will demonstrate appropriate understanding and performance of initially prescribed HEP in order to facilitate improved independence with management of symptoms.  Baseline: HEP provided on eval Goal status: INITIAL   2. Pt will improve greater than 1x MCD on FOTO in order to demonstrate improved perception of function due to symptoms.  Baseline: not assessed at eval d/t time constraints  Goal status: INITIAL   LONG TERM GOALS: Target date: 10/31/2022    Pt will meet predicted score on FOTO in order to demonstrate improved perception of function due to symptoms. Baseline: not assessed at eval due to time constraints Goal status: INITIAL  2.  Pt will demonstrate at least 140 degrees of active shoulder elevation on B in order to demonstrate improved tolerance to work activities Baseline: see ROM chart above Goal status: INITIAL  3.  Pt will demonstrate 0-140 active elbow extension-flexion on LUE for improved mechanics with daily tasks Baseline: see ROM chart above Goal status: INITIAL  4. Pt will report/demonstrate ability to perform upper body dressing with  less than 2 point increase in pain on NPS in order to indicative improved tolerance/independence with functional tasks.  Baseline: up to 8/10 with typical daily tasks  Goal status: INITIAL   PLAN:  PT FREQUENCY: 2x/week  PT DURATION: 8 weeks  PLANNED INTERVENTIONS: Therapeutic exercises, Therapeutic activity, Neuromuscular re-education, Balance training, Gait training, Patient/Family education, Self Care, Joint mobilization, DME instructions, Dry Needling, Cryotherapy, Moist heat, scar  mobilization, Taping, Manual therapy, and Re-evaluation  PLAN FOR NEXT SESSION: Progress ROM exercises as able/appropriate, review HEP.   Ashley Murrain PT, DPT 09/05/2022 1:10 PM

## 2022-09-05 ENCOUNTER — Other Ambulatory Visit: Payer: Self-pay

## 2022-09-05 ENCOUNTER — Ambulatory Visit: Payer: Self-pay | Attending: Student | Admitting: Physical Therapy

## 2022-09-05 ENCOUNTER — Encounter: Payer: Self-pay | Admitting: Physical Therapy

## 2022-09-05 DIAGNOSIS — R293 Abnormal posture: Secondary | ICD-10-CM | POA: Insufficient documentation

## 2022-09-05 DIAGNOSIS — M6281 Muscle weakness (generalized): Secondary | ICD-10-CM | POA: Insufficient documentation

## 2022-09-05 DIAGNOSIS — M25522 Pain in left elbow: Secondary | ICD-10-CM | POA: Insufficient documentation

## 2022-09-05 DIAGNOSIS — M25622 Stiffness of left elbow, not elsewhere classified: Secondary | ICD-10-CM | POA: Insufficient documentation

## 2022-09-05 DIAGNOSIS — M25612 Stiffness of left shoulder, not elsewhere classified: Secondary | ICD-10-CM | POA: Insufficient documentation

## 2022-09-13 ENCOUNTER — Ambulatory Visit: Payer: Self-pay | Admitting: Physical Therapy

## 2022-09-13 ENCOUNTER — Encounter: Payer: Self-pay | Admitting: Physical Therapy

## 2022-09-13 DIAGNOSIS — M25622 Stiffness of left elbow, not elsewhere classified: Secondary | ICD-10-CM

## 2022-09-13 DIAGNOSIS — M25522 Pain in left elbow: Secondary | ICD-10-CM

## 2022-09-13 DIAGNOSIS — M6281 Muscle weakness (generalized): Secondary | ICD-10-CM

## 2022-09-13 NOTE — Therapy (Signed)
OUTPATIENT PHYSICAL THERAPY TREATMENT NOTE   Patient Name: Adrian Hall MRN: [id number] DOB:Mar 15, 1976, 46 y.o., male Today's Date: 09/13/2022  PCP: none in chart   REFERRING PROVIDER: Roby Lofts MD  END OF SESSION:   PT End of Session - 09/13/22 0851     Visit Number 2    Number of Visits 17    Date for PT Re-Evaluation 10/31/22    Authorization Type no coverage in chart    PT Start Time 0849    PT Stop Time 0930    PT Time Calculation (min) 41 min             History reviewed. No pertinent past medical history. Past Surgical History:  Procedure Laterality Date   ORIF HUMERUS FRACTURE Left 08/17/2022   Procedure: OPEN REDUCTION INTERNAL FIXATION (ORIF) DISTAL HUMERUS FRACTURE;  Surgeon: Roby Lofts, MD;  Location: [facility];  Service: Orthopedics;  Laterality: Left;   Patient Active Problem List   Diagnosis Date Noted   Closed fracture of left distal humerus     REFERRING DIAG: L distal humerus fracture  THERAPY DIAG:  Pain in left elbow  Stiffness of left elbow, not elsewhere classified  Muscle weakness (generalized)  Rationale for Evaluation and Treatment Rehabilitation  PERTINENT HISTORY: L humeral ORIF 08/17/22  PRECAUTIONS: S/p L humeral ORIF 08/17/22, NWB LUE with unrestricted ROM of elbow   SUBJECTIVE:                                                                                                                                                                                      SUBJECTIVE STATEMENT:  I have been doing the exercises. I have 5/10 pain from mid upper arm to the hand.    PAIN:  Are you having pain: yes, 5/10 Location: L elbow and wrist, mild numbness/tingling dorsal thumb How would you describe your pain? Difficulty describing Best in past week: 0/10 Worst in past week: 8/10 Aggravating factors: use of LUE, sleeping, prolonged positioning Easing factors: medication, rest, sling   OBJECTIVE: (objective measures  completed at initial evaluation unless otherwise dated)   DIAGNOSTIC FINDINGS:  S/p ORIF L humerus, ROM unrestricted, NWB   PATIENT SURVEYS:  FOTO deferred given time constraints and use of interpreter services, will plan to assess next visit as able   COGNITION: Overall cognitive status: Within functional limits for tasks assessed                                  SENSATION: Light touch intact B UE aside from diminished on anterior/dorsal aspect of LUE distal  to surgical site   POSTURE/OBSERVATION: Guarded posture, sling use, increased L UT elevation Incision on posterior aspect of UE unremarkable, appears clean and intact Mild bruising just proximal to anterior aspect of elbow; mild swelling in fingers but not outside normal limits given procedure and sling use   UPPER EXTREMITY ROM:    ROM Right eval AROM/PROM Left eval ROM 09/13/22  Shoulder flexion 148 p and significant UT compensation Not formally tested (see comment)   Shoulder extension       Shoulder abduction       Shoulder adduction       Shoulder internal rotation       Shoulder external rotation       Elbow flexion WNL 85/NT   Elbow extension WNL -35/-30 -32 supine  Wrist flexion       Wrist extension       Wrist ulnar deviation       Wrist radial deviation       Wrist pronation   70/NT   Wrist supination   60/NT    (Blank rows = not tested) Comments: able to make fist on L although limited d/t mild swelling (as expected post operatively with sling use), full finger extension. Serial opposition on L WNL for ROM. Shoulder ROM NT on L due to symptom irritability but pt able to achieve ~80 deg flexion at least with functional observation   UPPER EXTREMITY MMT:   MMT Right eval Left eval  Shoulder flexion      Shoulder extension      Shoulder abduction      Shoulder adduction      Shoulder internal rotation      Shoulder external rotation      Middle trapezius      Lower trapezius      Elbow flexion       Elbow extension      Wrist flexion      Wrist extension      Wrist ulnar deviation      Wrist radial deviation      Wrist pronation      Wrist supination      Grip strength (lbs)      (Blank rows = not tested) Comments: MMT deferred on eval given acuity of surgery   JOINT MOBILITY TESTING:  Deferred given acuity of surgery   PALPATION:  No overt muscular tenderness             TODAY'S TREATMENT:                                                                                                                                     [facility] Adult PT Treatment:  DATE: 09/13/22 Therapeutic Exercise: Seated (arm resting on high mat table) A/AROM elbow and wrist with and without over pressure in the following:  Elbow flexion  Elbow Ext Elbow Pronation Elbow Supination  Wrist Flexion Wrist ext Radial deviation  Towel squeeze 5 sec x 10 Supine supported elbow , elbow extension stretch  Standing pendulums   [facility] Adult PT Treatment:                                                DATE: 09/05/22 Therapeutic Exercise: Active assisted elbow flex/ext x10 within tolerable ROM Active assisted supination/pronation x10 within tolerable ROM       PATIENT EDUCATION: Education details: Pt education on PT impairments, prognosis, and POC. Informed consent. Rationale for interventions, safe/appropriate HEP performance Person educated: Patient Education method: Explanation, Demonstration, Tactile cues, Verbal cues, and Handouts Education comprehension: verbalized understanding, returned demonstration, verbal cues required, tactile cues required, and needs further education     HOME EXERCISE PROGRAM: Access Code: [email or web address] URL: [email or web address] Date: 09/05/2022 Prepared by: Fransisco Hertzavid Guthrie   Exercises - Seated Elbow Flexion AAROM  - 1 x daily - 7 x weekly - 3 sets - 10 reps - Seated Forearm Pronation and Supination AROM  - 1 x daily  - 7 x weekly - 3 sets - 10 reps   ASSESSMENT:   CLINICAL IMPRESSION: Patient is a 46 y.o. gentleman who was seen today for physical therapy treatment for L humeral fracture s/p ORIF on 08/17/22. Per op note, pt to be NWB LUE with unrestricted ROM of elbow. Pt arrives with sling and reports compliance with HEP. Reviewed HEP and progress with elbow A/AROM and gentle grip strengthening. He tolerated the session well with some increased pain at end ranges. His elbow extension remains stiff. Would benefit from continued skilled PT to maximize functional UE use for return to PLOF.    OBJECTIVE IMPAIRMENTS: decreased activity tolerance, decreased endurance, decreased mobility, decreased ROM, decreased strength, hypomobility, increased edema, impaired UE functional use, postural dysfunction, and pain.    ACTIVITY LIMITATIONS: carrying, lifting, sleeping, transfers, bathing, toileting, dressing, reach over head, and hygiene/grooming   PARTICIPATION LIMITATIONS: meal prep, cleaning, laundry, driving, community activity, and occupation   PERSONAL FACTORS: Profession are also affecting patient's functional outcome.    REHAB POTENTIAL: Good   CLINICAL DECISION MAKING: Stable/uncomplicated   EVALUATION COMPLEXITY: Low     GOALS: Goals reviewed with patient? No   SHORT TERM GOALS: Target date: 10/02/2022     Pt will demonstrate appropriate understanding and performance of initially prescribed HEP in order to facilitate improved independence with management of symptoms.  Baseline: HEP provided on eval Goal status: INITIAL    2. Pt will improve greater than 1x MCD on FOTO in order to demonstrate improved perception of function due to symptoms.            Baseline: not assessed at eval d/t time constraints            Goal status: INITIAL    LONG TERM GOALS: Target date: 10/31/2022     Pt will meet predicted score on FOTO in order to demonstrate improved perception of function due to  symptoms. Baseline: not assessed at eval due to time constraints Goal status: INITIAL   2.  Pt will demonstrate at least 140 degrees of active shoulder elevation on B in order to  demonstrate improved tolerance to work activities Baseline: see ROM chart above Goal status: INITIAL   3.  Pt will demonstrate 0-140 active elbow extension-flexion on LUE for improved mechanics with daily tasks Baseline: see ROM chart above Goal status: INITIAL   4. Pt will report/demonstrate ability to perform upper body dressing with less than 2 point increase in pain on NPS in order to indicative improved tolerance/independence with functional tasks.            Baseline: up to 8/10 with typical daily tasks            Goal status: INITIAL    PLAN:   PT FREQUENCY: 2x/week   PT DURATION: 8 weeks   PLANNED INTERVENTIONS: Therapeutic exercises, Therapeutic activity, Neuromuscular re-education, Balance training, Gait training, Patient/Family education, Self Care, Joint mobilization, DME instructions, Dry Needling, Cryotherapy, Moist heat, scar mobilization, Taping, Manual therapy, and Re-evaluation   PLAN FOR NEXT SESSION: Progress ROM exercises as able/appropriate, review HEP.      Jannette Spanner, PTA 09/13/22 2:26 PM Phone: [phone] Fax: [phone]

## 2022-09-14 ENCOUNTER — Encounter: Payer: Self-pay | Admitting: Physical Therapy

## 2022-09-14 ENCOUNTER — Ambulatory Visit: Payer: Self-pay | Admitting: Physical Therapy

## 2022-09-14 DIAGNOSIS — M25622 Stiffness of left elbow, not elsewhere classified: Secondary | ICD-10-CM

## 2022-09-14 DIAGNOSIS — M25612 Stiffness of left shoulder, not elsewhere classified: Secondary | ICD-10-CM

## 2022-09-14 DIAGNOSIS — R293 Abnormal posture: Secondary | ICD-10-CM

## 2022-09-14 DIAGNOSIS — M6281 Muscle weakness (generalized): Secondary | ICD-10-CM

## 2022-09-14 DIAGNOSIS — M25522 Pain in left elbow: Secondary | ICD-10-CM

## 2022-09-14 NOTE — Therapy (Signed)
OUTPATIENT PHYSICAL THERAPY TREATMENT NOTE   Patient Name: Adrian Hall MRN: [id number] DOB:April 19, 1976, 46 y.o., male Today's Date: 09/14/2022  PCP: none in chart   REFERRING PROVIDER: Shona Needles MD  END OF SESSION:   PT End of Session - 09/14/22 0806     Visit Number 3    Number of Visits 17    Date for PT Re-Evaluation 10/31/22    Authorization Type no coverage in chart    PT Start Time 0805    PT Stop Time 0855    PT Time Calculation (min) 50 min             History reviewed. No pertinent past medical history. Past Surgical History:  Procedure Laterality Date   ORIF HUMERUS FRACTURE Left 08/17/2022   Procedure: OPEN REDUCTION INTERNAL FIXATION (ORIF) DISTAL HUMERUS FRACTURE;  Surgeon: Shona Needles, MD;  Location: [facility];  Service: Orthopedics;  Laterality: Left;   Patient Active Problem List   Diagnosis Date Noted   Closed fracture of left distal humerus     REFERRING DIAG: L distal humerus fracture  THERAPY DIAG:  Pain in left elbow  Stiffness of left elbow, not elsewhere classified  Muscle weakness (generalized)  Abnormal posture  Stiffness of left shoulder, not elsewhere classified  Rationale for Evaluation and Treatment Rehabilitation  PERTINENT HISTORY: L humeral ORIF 08/17/22  PRECAUTIONS: S/p L humeral ORIF 08/17/22, NWB LUE with unrestricted ROM of elbow   SUBJECTIVE:                                                                                                                                                                                      SUBJECTIVE STATEMENT: The arm hurt after yesterday's treatment but I put ice on it and then did some more exercises last night. My pain is 6/10 today and it is like a pulsing or throbbing. (Subjective captured via in person interpreter).      PAIN:  Are you having pain: yes, 6/10 Location: L elbow and wrist, mild numbness/tingling dorsal thumb How would you describe your pain? Difficulty  describing, pulsing  Best in past week: 0/10 Worst in past week: 8/10 Aggravating factors: use of LUE, sleeping, prolonged positioning Easing factors: medication, rest, sling   OBJECTIVE: (objective measures completed at initial evaluation unless otherwise dated)   DIAGNOSTIC FINDINGS:  S/p ORIF L humerus, ROM unrestricted, NWB   PATIENT SURVEYS:  FOTO deferred given time constraints and use of interpreter services, will plan to assess next visit as able FOTO Intake 09/14/22: : 31% with prediction 63%   COGNITION: Overall cognitive status: Within functional limits for tasks assessed  SENSATION: Light touch intact B UE aside from diminished on anterior/dorsal aspect of LUE distal to surgical site   POSTURE/OBSERVATION: Guarded posture, sling use, increased L UT elevation Incision on posterior aspect of UE unremarkable, appears clean and intact Mild bruising just proximal to anterior aspect of elbow; mild swelling in fingers but not outside normal limits given procedure and sling use   UPPER EXTREMITY ROM:    ROM Right eval AROM/PROM Left eval ROM 09/13/22  Shoulder flexion 148 p and significant UT compensation Not formally tested (see comment)   Shoulder extension       Shoulder abduction       Shoulder adduction       Shoulder internal rotation       Shoulder external rotation       Elbow flexion WNL 85/NT   Elbow extension WNL -35/-30 -32 supine  Wrist flexion       Wrist extension       Wrist ulnar deviation       Wrist radial deviation       Wrist pronation   70/NT   Wrist supination   60/NT    (Blank rows = not tested) Comments: able to make fist on L although limited d/t mild swelling (as expected post operatively with sling use), full finger extension. Serial opposition on L WNL for ROM. Shoulder ROM NT on L due to symptom irritability but pt able to achieve ~80 deg flexion at least with functional observation   UPPER EXTREMITY  MMT:   MMT Right eval Left eval  Shoulder flexion      Shoulder extension      Shoulder abduction      Shoulder adduction      Shoulder internal rotation      Shoulder external rotation      Middle trapezius      Lower trapezius      Elbow flexion      Elbow extension      Wrist flexion      Wrist extension      Wrist ulnar deviation      Wrist radial deviation      Wrist pronation      Wrist supination      Grip strength (lbs)      (Blank rows = not tested) Comments: MMT deferred on eval given acuity of surgery   JOINT MOBILITY TESTING:  Deferred given acuity of surgery   PALPATION:  No overt muscular tenderness             TODAY'S TREATMENT:                                                                                                                                     [facility] Adult PT Treatment:  DATE: 09/14/22 FOTO INTAKE 31% Therapeutic Exercise: Seated (arm resting on high mat table) A/AROM elbow and wrist with and without over pressure in the following:  Elbow flexion  Elbow Ext Elbow Pronation Elbow Supination  Wrist Flexion Wrist ext Radial deviation  Towel squeeze 5 sec x 10 Standing pendulums Manual Therapy Gentle effleurage strokes to forearm and bicep sitting with arm propped on edge of high mat table Modalities:  Ice pack to left elbow supine with pillows elevating left arm x 10 minutes  Self Care Instructed pt in appropriate positioning with LUE elevated above heart using pillows to decrease edema.    [facility] Adult PT Treatment:                                                DATE: 09/13/22 Therapeutic Exercise: Seated (arm resting on high mat table) A/AROM elbow and wrist with and without over pressure in the following:  Elbow flexion  Elbow Ext Elbow Pronation Elbow Supination  Wrist Flexion Wrist ext Radial deviation  Towel squeeze 5 sec x 10 Supine supported elbow , elbow extension stretch   Standing pendulums   [facility] Adult PT Treatment:                                                DATE: 09/05/22 Therapeutic Exercise: Active assisted elbow flex/ext x10 within tolerable ROM Active assisted supination/pronation x10 within tolerable ROM       PATIENT EDUCATION: Education details: Pt education on PT impairments, prognosis, and POC. Informed consent. Rationale for interventions, safe/appropriate HEP performance Person educated: Patient Education method: Explanation, Demonstration, Tactile cues, Verbal cues, and Handouts Education comprehension: verbalized understanding, returned demonstration, verbal cues required, tactile cues required, and needs further education     HOME EXERCISE PROGRAM: Access Code: [email or web address] URL: [email or web address] Date: 09/05/2022 Prepared by: Fransisco Hertz   Exercises - Seated Elbow Flexion AAROM  - 1 x daily - 7 x weekly - 3 sets - 10 reps - Seated Forearm Pronation and Supination AROM  - 1 x daily - 7 x weekly - 3 sets - 10 reps   ASSESSMENT:   CLINICAL IMPRESSION: Patient is a 47 y.o. gentleman who was seen today for physical therapy treatment for L humeral fracture s/p ORIF on 08/17/22. Per op note, pt to be NWB LUE with unrestricted ROM of elbow. Pt arrives in sling and reports increased soreness after yesterday's treatment as expected. Today his pain is 6/10 and described and pulsating. Worked on LUE AROM/ AAROM per HEP. Added Manual therapy today to assist with desensitization and decrease muscle tension in elbow and forearm. Self Care instruction also performed for education in proper positioning of LUE for Ice application and elevation. Pt verbalized understanding. Ice pack applied today to assist in pain reduction. Would benefit from continued skilled PT to maximize functional UE use for return to PLOF.    OBJECTIVE IMPAIRMENTS: decreased activity tolerance, decreased endurance, decreased mobility, decreased ROM, decreased  strength, hypomobility, increased edema, impaired UE functional use, postural dysfunction, and pain.    ACTIVITY LIMITATIONS: carrying, lifting, sleeping, transfers, bathing, toileting, dressing, reach over head, and hygiene/grooming   PARTICIPATION LIMITATIONS: meal prep, cleaning, laundry, driving, community activity, and occupation   PERSONAL FACTORS:  Profession are also affecting patient's functional outcome.    REHAB POTENTIAL: Good   CLINICAL DECISION MAKING: Stable/uncomplicated   EVALUATION COMPLEXITY: Low     GOALS: Goals reviewed with patient? No   SHORT TERM GOALS: Target date: 10/02/2022     Pt will demonstrate appropriate understanding and performance of initially prescribed HEP in order to facilitate improved independence with management of symptoms.  Baseline: HEP provided on eval Goal status: INITIAL    2. Pt will improve greater than 1x MCD on FOTO in order to demonstrate improved perception of function due to symptoms.            Baseline: not assessed at eval d/t time constraints  Status: intake 31% taken at 3rd visit            Goal status: INITIAL    LONG TERM GOALS: Target date: 10/31/2022     Pt will meet predicted score on FOTO in order to demonstrate improved perception of function due to symptoms. Baseline: not assessed at eval due to time constraints Goal status: INITIAL   2.  Pt will demonstrate at least 140 degrees of active shoulder elevation on B in order to demonstrate improved tolerance to work activities Baseline: see ROM chart above Goal status: INITIAL   3.  Pt will demonstrate 0-140 active elbow extension-flexion on LUE for improved mechanics with daily tasks Baseline: see ROM chart above Goal status: INITIAL   4. Pt will report/demonstrate ability to perform upper body dressing with less than 2 point increase in pain on NPS in order to indicative improved tolerance/independence with functional tasks.            Baseline: up to 8/10  with typical daily tasks            Goal status: INITIAL    PLAN:   PT FREQUENCY: 2x/week   PT DURATION: 8 weeks   PLANNED INTERVENTIONS: Therapeutic exercises, Therapeutic activity, Neuromuscular re-education, Balance training, Gait training, Patient/Family education, Self Care, Joint mobilization, DME instructions, Dry Needling, Cryotherapy, Moist heat, scar mobilization, Taping, Manual therapy, and Re-evaluation   PLAN FOR NEXT SESSION: Progress ROM exercises as able/appropriate, review HEP.      Hessie Diener, PTA 09/14/22 9:22 AM Phone: [phone] Fax: [phone]

## 2022-09-20 ENCOUNTER — Ambulatory Visit: Payer: Self-pay | Attending: Student | Admitting: Physical Therapy

## 2022-09-20 DIAGNOSIS — M25522 Pain in left elbow: Secondary | ICD-10-CM | POA: Insufficient documentation

## 2022-09-20 DIAGNOSIS — M25622 Stiffness of left elbow, not elsewhere classified: Secondary | ICD-10-CM | POA: Insufficient documentation

## 2022-09-20 DIAGNOSIS — M25612 Stiffness of left shoulder, not elsewhere classified: Secondary | ICD-10-CM | POA: Insufficient documentation

## 2022-09-20 DIAGNOSIS — M6281 Muscle weakness (generalized): Secondary | ICD-10-CM | POA: Insufficient documentation

## 2022-09-20 DIAGNOSIS — R293 Abnormal posture: Secondary | ICD-10-CM | POA: Insufficient documentation

## 2022-09-20 NOTE — Therapy (Signed)
OUTPATIENT PHYSICAL THERAPY TREATMENT NOTE   Patient Name: Adrian Hall MRN: [id number] DOB:Jul 17, 1976, 46 y.o., male Today's Date: 09/20/2022  PCP: none in chart   REFERRING PROVIDER: Shona Needles MD  END OF SESSION:   PT End of Session - 09/20/22 0852     Visit Number 4    Number of Visits 17    Date for PT Re-Evaluation 10/31/22    Authorization Type no coverage in chart    PT Start Time 0848    PT Stop Time 0940    PT Time Calculation (min) 52 min             No past medical history on file. Past Surgical History:  Procedure Laterality Date   ORIF HUMERUS FRACTURE Left 08/17/2022   Procedure: OPEN REDUCTION INTERNAL FIXATION (ORIF) DISTAL HUMERUS FRACTURE;  Surgeon: Shona Needles, MD;  Location: [place];  Service: Orthopedics;  Laterality: Left;   Patient Active Problem List   Diagnosis Date Noted   Closed fracture of left distal humerus     REFERRING DIAG: L distal humerus fracture  THERAPY DIAG:  Pain in left elbow  Stiffness of left elbow, not elsewhere classified  Muscle weakness (generalized)  Rationale for Evaluation and Treatment Rehabilitation  PERTINENT HISTORY: L humeral ORIF 08/17/22  PRECAUTIONS: S/p L humeral ORIF 08/17/22, NWB LUE with unrestricted ROM of elbow   SUBJECTIVE:                                                                                                                                                                                      SUBJECTIVE STATEMENT: There is still a lot of pain. I am doing the exercises. The pain is 5/10. (Subjective captured via in person interpreter).      PAIN:  Are you having pain: yes, 5/10 Location: L elbow and wrist, mild numbness/tingling dorsal thumb How would you describe your pain? Difficulty describing, pulsing  Best in past week: 0/10 Worst in past week: 8/10 Aggravating factors: use of LUE, sleeping, prolonged positioning Easing factors: medication, rest,  sling   OBJECTIVE: (objective measures completed at initial evaluation unless otherwise dated)   DIAGNOSTIC FINDINGS:  S/p ORIF L humerus, ROM unrestricted, NWB   PATIENT SURVEYS:  FOTO deferred given time constraints and use of interpreter services, will plan to assess next visit as able FOTO Intake 09/14/22: : 31% with prediction 63%   COGNITION: Overall cognitive status: Within functional limits for tasks assessed  SENSATION: Light touch intact B UE aside from diminished on anterior/dorsal aspect of LUE distal to surgical site   POSTURE/OBSERVATION: Guarded posture, sling use, increased L UT elevation Incision on posterior aspect of UE unremarkable, appears clean and intact Mild bruising just proximal to anterior aspect of elbow; mild swelling in fingers but not outside normal limits given procedure and sling use   UPPER EXTREMITY ROM:    ROM Right eval AROM/PROM Left eval ROM 09/13/22 PROM 09/20/22  Shoulder flexion 148 p and significant UT compensation Not formally tested (see comment)    Shoulder extension        Shoulder abduction        Shoulder adduction        Shoulder internal rotation        Shoulder external rotation        Elbow flexion WNL 85/NT   Supine 96  Elbow extension WNL -35/-30 -32 supine  Supine -28  Wrist flexion        Wrist extension        Wrist ulnar deviation        Wrist radial deviation        Wrist pronation   70/NT    Wrist supination   60/NT     (Blank rows = not tested) Comments: able to make fist on L although limited d/t mild swelling (as expected post operatively with sling use), full finger extension. Serial opposition on L WNL for ROM. Shoulder ROM NT on L due to symptom irritability but pt able to achieve ~80 deg flexion at least with functional observation   UPPER EXTREMITY MMT:   MMT Right eval Left eval  Shoulder flexion      Shoulder extension      Shoulder abduction      Shoulder  adduction      Shoulder internal rotation      Shoulder external rotation      Middle trapezius      Lower trapezius      Elbow flexion      Elbow extension      Wrist flexion      Wrist extension      Wrist ulnar deviation      Wrist radial deviation      Wrist pronation      Wrist supination      Grip strength (lbs)      (Blank rows = not tested) Comments: MMT deferred on eval given acuity of surgery   JOINT MOBILITY TESTING:  Deferred given acuity of surgery   PALPATION:  No overt muscular tenderness             TODAY'S TREATMENT:                                                                                                                                     [place] Adult PT Treatment:  DATE: 09/20/22  Therapeutic Exercise: Seated (arm resting on high mat table) A/AROM elbow and wrist with and without over pressure in the following:  Elbow flexion  Elbow Ext    Standing pendulums Manual Therapy Supine passive Elbow extension and flexion with stabilization of the humerus.  Modalities:  HMP to left elbow supine with pillows elevating left arm x 10 minutes   [facility] Adult PT Treatment:                                                DATE: 09/14/22 FOTO INTAKE 31% Therapeutic Exercise: Seated (arm resting on high mat table) A/AROM elbow and wrist with and without over pressure in the following:  Elbow flexion  Elbow Ext Elbow Pronation Elbow Supination  Wrist Flexion Wrist ext Radial deviation  Towel squeeze 5 sec x 10 Standing pendulums Manual Therapy Gentle effleurage strokes to forearm and bicep sitting with arm propped on edge of high mat table Modalities:  Ice pack to left elbow supine with pillows elevating left arm x 10 minutes  Self Care Instructed pt in appropriate positioning with LUE elevated above heart using pillows to decrease edema.    [facility] Adult PT Treatment:                                                 DATE: 09/13/22 Therapeutic Exercise: Seated (arm resting on high mat table) A/AROM elbow and wrist with and without over pressure in the following:  Elbow flexion  Elbow Ext Elbow Pronation Elbow Supination  Wrist Flexion Wrist ext Radial deviation  Towel squeeze 5 sec x 10 Supine supported elbow , elbow extension stretch  Standing pendulums   [facility] Adult PT Treatment:                                                DATE: 09/05/22 Therapeutic Exercise: Active assisted elbow flex/ext x10 within tolerable ROM Active assisted supination/pronation x10 within tolerable ROM       PATIENT EDUCATION: Education details: Pt education on PT impairments, prognosis, and POC. Informed consent. Rationale for interventions, safe/appropriate HEP performance Person educated: Patient Education method: Explanation, Demonstration, Tactile cues, Verbal cues, and Handouts Education comprehension: verbalized understanding, returned demonstration, verbal cues required, tactile cues required, and needs further education     HOME EXERCISE PROGRAM: Access Code: [email or web address] URL: [email or web address] Date: 09/05/2022 Prepared by: Enis Slipper   Exercises - Seated Elbow Flexion AAROM  - 1 x daily - 7 x weekly - 3 sets - 10 reps - Seated Forearm Pronation and Supination AROM  - 1 x daily - 7 x weekly - 3 sets - 10 reps   ASSESSMENT:   CLINICAL IMPRESSION: Patient is a 46 y.o. gentleman who was seen today for physical therapy treatment for L humeral fracture s/p ORIF on 08/17/22. Per op note, pt to be NWB LUE with unrestricted ROM of elbow. Pt arrives in sling and reports continued pain. He saw MD today and he has written orders to "continue aggressive ROM of elbow. Goal to be able to reach mouth and back of head  in next 3-4 weeks."  Today his pain is 5/10. Focused on  L elbow  PROM. He reported improvement in pain after STW last visit so repeated bicep STW. His PROM has improved however still  significantly limited. Trial of HMP today as he reports improvement in pain after a hot shower. Would benefit from continued skilled PT to maximize functional UE use for return to PLOF.    OBJECTIVE IMPAIRMENTS: decreased activity tolerance, decreased endurance, decreased mobility, decreased ROM, decreased strength, hypomobility, increased edema, impaired UE functional use, postural dysfunction, and pain.    ACTIVITY LIMITATIONS: carrying, lifting, sleeping, transfers, bathing, toileting, dressing, reach over head, and hygiene/grooming   PARTICIPATION LIMITATIONS: meal prep, cleaning, laundry, driving, community activity, and occupation   PERSONAL FACTORS: Profession are also affecting patient's functional outcome.    REHAB POTENTIAL: Good   CLINICAL DECISION MAKING: Stable/uncomplicated   EVALUATION COMPLEXITY: Low     GOALS: Goals reviewed with patient? No   SHORT TERM GOALS: Target date: 10/02/2022     Pt will demonstrate appropriate understanding and performance of initially prescribed HEP in order to facilitate improved independence with management of symptoms.  Baseline: HEP provided on eval Goal status: INITIAL    2. Pt will improve greater than 1x MCD on FOTO in order to demonstrate improved perception of function due to symptoms.            Baseline: not assessed at eval d/t time constraints  Status: intake 31% taken at 3rd visit            Goal status: INITIAL    LONG TERM GOALS: Target date: 10/31/2022     Pt will meet predicted score on FOTO in order to demonstrate improved perception of function due to symptoms. Baseline: not assessed at eval due to time constraints Goal status: INITIAL   2.  Pt will demonstrate at least 140 degrees of active shoulder elevation on B in order to demonstrate improved tolerance to work activities Baseline: see ROM chart above Goal status: INITIAL   3.  Pt will demonstrate 0-140 active elbow extension-flexion on LUE for improved  mechanics with daily tasks Baseline: see ROM chart above Goal status: INITIAL   4. Pt will report/demonstrate ability to perform upper body dressing with less than 2 point increase in pain on NPS in order to indicative improved tolerance/independence with functional tasks.            Baseline: up to 8/10 with typical daily tasks            Goal status: INITIAL    PLAN:   PT FREQUENCY: 2x/week   PT DURATION: 8 weeks   PLANNED INTERVENTIONS: Therapeutic exercises, Therapeutic activity, Neuromuscular re-education, Balance training, Gait training, Patient/Family education, Self Care, Joint mobilization, DME instructions, Dry Needling, Cryotherapy, Moist heat, scar mobilization, Taping, Manual therapy, and Re-evaluation   PLAN FOR NEXT SESSION: Progress ROM exercises as able/appropriate, review HEP. PER MD on 09/19/22 : "continue aggressive ROM of elbow. Goal to be able to reach mouth and back of head in next 3-4 weeks."      Jannette Spanner, PTA 09/20/22 10:54 AM Phone: [phone] Fax: [phone]

## 2022-09-21 NOTE — Therapy (Signed)
OUTPATIENT PHYSICAL THERAPY TREATMENT NOTE   Patient Name: Adrian Hall MRN: [id number] DOB:1976-01-24, 46 y.o., male Today's Date: 09/22/2022  PCP: none in chart   REFERRING PROVIDER: Roby Lofts MD  END OF SESSION:   PT End of Session - 09/22/22 0842     Visit Number 5    Number of Visits 17    Date for PT Re-Evaluation 10/31/22    Authorization Type no coverage in chart    PT Start Time 0844    PT Stop Time 0923    PT Time Calculation (min) 39 min    Activity Tolerance Patient tolerated treatment well;No increased pain    Behavior During Therapy [facility] for tasks assessed/performed              History reviewed. No pertinent past medical history. Past Surgical History:  Procedure Laterality Date   ORIF HUMERUS FRACTURE Left 08/17/2022   Procedure: OPEN REDUCTION INTERNAL FIXATION (ORIF) DISTAL HUMERUS FRACTURE;  Surgeon: Roby Lofts, MD;  Location: [facility];  Service: Orthopedics;  Laterality: Left;   Patient Active Problem List   Diagnosis Date Noted   Closed fracture of left distal humerus     REFERRING DIAG: L distal humerus fracture  THERAPY DIAG:  Pain in left elbow  Stiffness of left elbow, not elsewhere classified  Muscle weakness (generalized)  Abnormal posture  Stiffness of left shoulder, not elsewhere classified  Rationale for Evaluation and Treatment Rehabilitation  PERTINENT HISTORY: L humeral ORIF 08/17/22  PRECAUTIONS: S/p L humeral ORIF 08/17/22, NWB LUE with unrestricted ROM of elbow   SUBJECTIVE:                                                                                                                                                                                      SUBJECTIVE STATEMENT:   Appreciate assistance of in person interpreter throughout session. Pt arrives with moderate pain today, states he is doing about the same overall. After last session reports some significant soreness that lasted about a day.   Otherwise no new updates.   PAIN:  Are you having pain: yes, 5/10 Location: L elbow and wrist, mild numbness/tingling dorsal thumb How would you describe your pain? Difficulty describing, pulsing  Best in past week: 0/10 Worst in past week: 8/10 Aggravating factors: use of LUE, sleeping, prolonged positioning Easing factors: medication, rest, sling   OBJECTIVE: (objective measures completed at initial evaluation unless otherwise dated)   DIAGNOSTIC FINDINGS:  S/p ORIF L humerus, ROM unrestricted, NWB   PATIENT SURVEYS:  FOTO deferred given time constraints and use of interpreter services, will plan to assess next visit as able FOTO Intake 09/14/22: :  31% with prediction 63%   COGNITION: Overall cognitive status: Within functional limits for tasks assessed                                  SENSATION: Light touch intact B UE aside from diminished on anterior/dorsal aspect of LUE distal to surgical site   POSTURE/OBSERVATION: Guarded posture, sling use, increased L UT elevation Incision on posterior aspect of UE unremarkable, appears clean and intact Mild bruising just proximal to anterior aspect of elbow; mild swelling in fingers but not outside normal limits given procedure and sling use   UPPER EXTREMITY ROM:    ROM Right eval AROM/PROM Left eval ROM 09/13/22 PROM 09/20/22 AROM 09/22/22  Shoulder flexion 148 p and significant UT compensation Not formally tested (see comment)     Shoulder extension         Shoulder abduction         Shoulder adduction         Shoulder internal rotation         Shoulder external rotation         Elbow flexion WNL 85/NT   Supine 96 Supine 98  Elbow extension WNL -35/-30 -32 supine  Supine -28 Supine -25  Wrist flexion         Wrist extension         Wrist ulnar deviation         Wrist radial deviation         Wrist pronation   70/NT     Wrist supination   60/NT      (Blank rows = not tested) Comments: able to make fist on L although  limited d/t mild swelling (as expected post operatively with sling use), full finger extension. Serial opposition on L WNL for ROM. Shoulder ROM NT on L due to symptom irritability but pt able to achieve ~80 deg flexion at least with functional observation   UPPER EXTREMITY MMT:   MMT Right eval Left eval  Shoulder flexion      Shoulder extension      Shoulder abduction      Shoulder adduction      Shoulder internal rotation      Shoulder external rotation      Middle trapezius      Lower trapezius      Elbow flexion      Elbow extension      Wrist flexion      Wrist extension      Wrist ulnar deviation      Wrist radial deviation      Wrist pronation      Wrist supination      Grip strength (lbs)      (Blank rows = not tested) Comments: MMT deferred on eval given acuity of surgery   JOINT MOBILITY TESTING:  Deferred given acuity of surgery   PALPATION:  No overt muscular tenderness             TODAY'S TREATMENT:  [place] Adult PT Treatment:                                                DATE: 09/22/22 Therapeutic Exercise: Swiss ball supported shoulder/elbow rollout 2x5, cues for precautions and form, appropriate ROM Seated scap retraction, x20 cues for reduced compensation Seated elbow flexion/ext AAROM elbow supported on table w/ pillow, 2x10, cues for appropriate ROM and contralateral UE assist  Manual Therapy: Supine passive physiological movement  elbow flex/extension, humerus stabilized for comfort Gentle STM biceps + proximal triceps, seated w/ pillow support  Gentle scar mobilization (healing well but noted reduced pliability of skin around healed incision), seated w/ pillow support                               [facility] Adult PT Treatment:                                                DATE: 09/20/22  Therapeutic Exercise: Seated (arm resting on high mat table) A/AROM  elbow and wrist with and without over pressure in the following:  Elbow flexion  Elbow Ext    Standing pendulums Manual Therapy Supine passive Elbow extension and flexion with stabilization of the humerus.  Modalities:  HMP to left elbow supine with pillows elevating left arm x 10 minutes   [facility] Adult PT Treatment:                                                DATE: 09/14/22 FOTO INTAKE 31% Therapeutic Exercise: Seated (arm resting on high mat table) A/AROM elbow and wrist with and without over pressure in the following:  Elbow flexion  Elbow Ext Elbow Pronation Elbow Supination  Wrist Flexion Wrist ext Radial deviation  Towel squeeze 5 sec x 10 Standing pendulums Manual Therapy Gentle effleurage strokes to forearm and bicep sitting with arm propped on edge of high mat table Modalities:  Ice pack to left elbow supine with pillows elevating left arm x 10 minutes  Self Care Instructed pt in appropriate positioning with LUE elevated above heart using pillows to decrease edema.     PATIENT EDUCATION: Education details: HEP review, rationale for interventions Person educated: Patient Education method: Explanation, Demonstration, Tactile cues, Verbal cues, and Handouts Education comprehension: verbalized understanding, returned demonstration, verbal cues required, tactile cues required, and needs further education     HOME EXERCISE PROGRAM: Access Code: [email or web address] URL: [email or web address] Date: 09/05/2022 Prepared by: Fransisco Hertz   Exercises - Seated Elbow Flexion AAROM  - 1 x daily - 7 x weekly - 3 sets - 10 reps - Seated Forearm Pronation and Supination AROM  - 1 x daily - 7 x weekly - 3 sets - 10 reps   ASSESSMENT:   CLINICAL IMPRESSION: Patient is a 46 y.o. gentleman who was seen today for physical therapy treatment for L humeral fracture s/p ORIF on 08/17/22. Per op note, pt to be NWB LUE with unrestricted ROM of elbow. Pt reports gradual  improvement in mobility but continues with moderate  pain levels overall. Session initiated w/ manual focusing on tissue extensibility, addition of gentle STM for biceps/triceps given palpable muscle tightness that improves w/ manual, also initiated gentle scar mobilization due to noted reduction in pliability of skin around scar, otherwise appears to be healing quite well. Pt continues to report transient increases in pain with stretching that does not increase resting pain, modest improvements in AROM as noted above. No adverse events, pt tolerates session well with rest, denies any increases in resting pain. Pt departs today's session in no acute distress, all voiced questions/concerns addressed appropriately from PT perspective.       OBJECTIVE IMPAIRMENTS: decreased activity tolerance, decreased endurance, decreased mobility, decreased ROM, decreased strength, hypomobility, increased edema, impaired UE functional use, postural dysfunction, and pain.    ACTIVITY LIMITATIONS: carrying, lifting, sleeping, transfers, bathing, toileting, dressing, reach over head, and hygiene/grooming   PARTICIPATION LIMITATIONS: meal prep, cleaning, laundry, driving, community activity, and occupation   PERSONAL FACTORS: Profession are also affecting patient's functional outcome.    REHAB POTENTIAL: Good   CLINICAL DECISION MAKING: Stable/uncomplicated   EVALUATION COMPLEXITY: Low     GOALS: Goals reviewed with patient? No   SHORT TERM GOALS: Target date: 10/02/2022     Pt will demonstrate appropriate understanding and performance of initially prescribed HEP in order to facilitate improved independence with management of symptoms.  Baseline: HEP provided on eval Goal status: INITIAL    2. Pt will improve greater than 1x MCD on FOTO in order to demonstrate improved perception of function due to symptoms.            Baseline: not assessed at eval d/t time constraints  Status: intake 31% taken at 3rd  visit            Goal status: INITIAL    LONG TERM GOALS: Target date: 10/31/2022     Pt will meet predicted score on FOTO in order to demonstrate improved perception of function due to symptoms. Baseline: not assessed at eval due to time constraints Goal status: INITIAL   2.  Pt will demonstrate at least 140 degrees of active shoulder elevation on B in order to demonstrate improved tolerance to work activities Baseline: see ROM chart above Goal status: INITIAL   3.  Pt will demonstrate 0-140 active elbow extension-flexion on LUE for improved mechanics with daily tasks Baseline: see ROM chart above Goal status: INITIAL   4. Pt will report/demonstrate ability to perform upper body dressing with less than 2 point increase in pain on NPS in order to indicative improved tolerance/independence with functional tasks.            Baseline: up to 8/10 with typical daily tasks            Goal status: INITIAL    PLAN:   PT FREQUENCY: 2x/week   PT DURATION: 8 weeks   PLANNED INTERVENTIONS: Therapeutic exercises, Therapeutic activity, Neuromuscular re-education, Balance training, Gait training, Patient/Family education, Self Care, Joint mobilization, DME instructions, Dry Needling, Cryotherapy, Moist heat, scar mobilization, Taping, Manual therapy, and Re-evaluation   PLAN FOR NEXT SESSION:  Progress ROM exercises as able/appropriate, review/update HEP.  PER MD on 09/19/22 : "continue aggressive ROM of elbow. Goal to be able to reach mouth and back of head in next 3-4 weeks."     Ashley Murrain PT, DPT 09/22/2022 9:31 AM

## 2022-09-22 ENCOUNTER — Ambulatory Visit: Payer: Self-pay | Admitting: Physical Therapy

## 2022-09-22 ENCOUNTER — Encounter: Payer: Self-pay | Admitting: Physical Therapy

## 2022-09-22 DIAGNOSIS — M25612 Stiffness of left shoulder, not elsewhere classified: Secondary | ICD-10-CM

## 2022-09-22 DIAGNOSIS — M6281 Muscle weakness (generalized): Secondary | ICD-10-CM

## 2022-09-22 DIAGNOSIS — R293 Abnormal posture: Secondary | ICD-10-CM

## 2022-09-22 DIAGNOSIS — M25522 Pain in left elbow: Secondary | ICD-10-CM

## 2022-09-22 DIAGNOSIS — M25622 Stiffness of left elbow, not elsewhere classified: Secondary | ICD-10-CM

## 2022-09-25 ENCOUNTER — Encounter: Payer: Self-pay | Admitting: Physical Therapy

## 2022-09-25 ENCOUNTER — Ambulatory Visit: Payer: Self-pay | Admitting: Physical Therapy

## 2022-09-25 DIAGNOSIS — M25612 Stiffness of left shoulder, not elsewhere classified: Secondary | ICD-10-CM

## 2022-09-25 DIAGNOSIS — R293 Abnormal posture: Secondary | ICD-10-CM

## 2022-09-25 DIAGNOSIS — M25622 Stiffness of left elbow, not elsewhere classified: Secondary | ICD-10-CM

## 2022-09-25 DIAGNOSIS — M6281 Muscle weakness (generalized): Secondary | ICD-10-CM

## 2022-09-25 DIAGNOSIS — M25522 Pain in left elbow: Secondary | ICD-10-CM

## 2022-09-25 NOTE — Therapy (Addendum)
OUTPATIENT PHYSICAL THERAPY TREATMENT NOTE (NO CHARGE VISIT)   Patient Name: Adrian Hall MRN: [id number] DOB:1975-10-22, 46 y.o., male Today's Date: 09/25/2022  PCP: none in chart   REFERRING PROVIDER: Roby Lofts MD  END OF SESSION:   PT End of Session - 09/25/22 0845     Visit Number 5    Number of Visits 17    Date for PT Re-Evaluation 10/31/22    Authorization Type no coverage in chart    PT Start Time 0845    Activity Tolerance No increased pain;Treatment limited secondary to medical complications (Comment)   see subjective/assessment sections   Behavior During Therapy [facility] for tasks assessed/performed               History reviewed. No pertinent past medical history. Past Surgical History:  Procedure Laterality Date   ORIF HUMERUS FRACTURE Left 08/17/2022   Procedure: OPEN REDUCTION INTERNAL FIXATION (ORIF) DISTAL HUMERUS FRACTURE;  Surgeon: Roby Lofts, MD;  Location: [facility] OR;  Service: Orthopedics;  Laterality: Left;   Patient Active Problem List   Diagnosis Date Noted   Closed fracture of left distal humerus     REFERRING DIAG: L distal humerus fracture  THERAPY DIAG:  Pain in left elbow  Stiffness of left elbow, not elsewhere classified  Muscle weakness (generalized)  Abnormal posture  Stiffness of left shoulder, not elsewhere classified  Rationale for Evaluation and Treatment Rehabilitation  PERTINENT HISTORY: L humeral ORIF 08/17/22  PRECAUTIONS: S/p L humeral ORIF 08/17/22, NWB LUE with unrestricted ROM of elbow   SUBJECTIVE:                                                                                                                                                                                      SUBJECTIVE STATEMENT:  Appreciate assistance of in person interpreter throughout session. Pt arrives with increased pain, states he woke up at 3am this morning with increased pain. At 5am he states he woke up again and noticed a  protrusion along his medial elbow which he had not noticed before. Denies any precipitating events or changes in activity over weekend, denies any increase in pain/soreness after last session.  PAIN:  Are you having pain: yes Location: L elbow and wrist, mild numbness/tingling dorsal thumb How would you describe your pain? Difficulty describing, pulsing  Best in past week: 0/10 Worst in past week: 8/10 Aggravating factors: use of LUE, sleeping, prolonged positioning Easing factors: medication, rest, sling   OBJECTIVE: (objective measures completed at initial evaluation unless otherwise dated)   DIAGNOSTIC FINDINGS:  S/p ORIF L humerus, ROM unrestricted, NWB   PATIENT SURVEYS:  FOTO deferred given time  constraints and use of interpreter services, will plan to assess next visit as able FOTO Intake 09/14/22: : 31% with prediction 63%   COGNITION: Overall cognitive status: Within functional limits for tasks assessed                                  SENSATION: Light touch intact B UE aside from diminished on anterior/dorsal aspect of LUE distal to surgical site   POSTURE/OBSERVATION: Guarded posture, sling use, increased L UT elevation Incision on posterior aspect of UE unremarkable, appears clean and intact Mild bruising just proximal to anterior aspect of elbow; mild swelling in fingers but not outside normal limits given procedure and sling use   UPPER EXTREMITY ROM:    ROM Right eval AROM/PROM Left eval ROM 09/13/22 PROM 09/20/22 AROM 09/22/22  Shoulder flexion 148 p and significant UT compensation Not formally tested (see comment)     Shoulder extension         Shoulder abduction         Shoulder adduction         Shoulder internal rotation         Shoulder external rotation         Elbow flexion WNL 85/NT   Supine 96 Supine 98  Elbow extension WNL -35/-30 -32 supine  Supine -28 Supine -25  Wrist flexion         Wrist extension         Wrist ulnar deviation          Wrist radial deviation         Wrist pronation   70/NT     Wrist supination   60/NT      (Blank rows = not tested) Comments: able to make fist on L although limited d/t mild swelling (as expected post operatively with sling use), full finger extension. Serial opposition on L WNL for ROM. Shoulder ROM NT on L due to symptom irritability but pt able to achieve ~80 deg flexion at least with functional observation   UPPER EXTREMITY MMT:   MMT Right eval Left eval  Shoulder flexion      Shoulder extension      Shoulder abduction      Shoulder adduction      Shoulder internal rotation      Shoulder external rotation      Middle trapezius      Lower trapezius      Elbow flexion      Elbow extension      Wrist flexion      Wrist extension      Wrist ulnar deviation      Wrist radial deviation      Wrist pronation      Wrist supination      Grip strength (lbs)      (Blank rows = not tested) Comments: MMT deferred on eval given acuity of surgery   JOINT MOBILITY TESTING:  Deferred given acuity of surgery   PALPATION:  No overt muscular tenderness             TODAY'S TREATMENT:  [facility] Adult PT Treatment:                                                DATE: 09/25/22 No charge - see subjective/assessment, treatment deferred on this date  [facility] Adult PT Treatment:                                                DATE: 09/22/22 Therapeutic Exercise: Swiss ball supported shoulder/elbow rollout 2x5, cues for precautions and form, appropriate ROM Seated scap retraction, x20 cues for reduced compensation Seated elbow flexion/ext AAROM elbow supported on table w/ pillow, 2x10, cues for appropriate ROM and contralateral UE assist  Manual Therapy: Supine passive physiological movement  elbow flex/extension, humerus stabilized for comfort Gentle STM biceps + proximal triceps, seated w/ pillow  support  Gentle scar mobilization (healing well but noted reduced pliability of skin around healed incision), seated w/ pillow support                               [facility] Adult PT Treatment:                                                DATE: 09/20/22  Therapeutic Exercise: Seated (arm resting on high mat table) A/AROM elbow and wrist with and without over pressure in the following:  Elbow flexion  Elbow Ext    Standing pendulums Manual Therapy Supine passive Elbow extension and flexion with stabilization of the humerus.  Modalities:  HMP to left elbow supine with pillows elevating left arm x 10 minutes   [facility] Adult PT Treatment:                                                DATE: 09/14/22 FOTO INTAKE 31% Therapeutic Exercise: Seated (arm resting on high mat table) A/AROM elbow and wrist with and without over pressure in the following:  Elbow flexion  Elbow Ext Elbow Pronation Elbow Supination  Wrist Flexion Wrist ext Radial deviation  Towel squeeze 5 sec x 10 Standing pendulums Manual Therapy Gentle effleurage strokes to forearm and bicep sitting with arm propped on edge of high mat table Modalities:  Ice pack to left elbow supine with pillows elevating left arm x 10 minutes  Self Care Instructed pt in appropriate positioning with LUE elevated above heart using pillows to decrease edema.     PATIENT EDUCATION: Education details: discussion re: change in symptoms, monitoring symptoms, communication w/ provider Person educated: Patient Education method: Explanation, Demonstration, Tactile cues, Verbal cues Education comprehension: verbalized understanding, returned demonstration, verbal cues required, tactile cues required, and needs further education     HOME EXERCISE PROGRAM: Access Code: [email or web address] URL: [email or web address] Date: 09/05/2022 Prepared by: Fransisco Hertz   Exercises - Seated Elbow Flexion AAROM  - 1 x daily - 7 x weekly - 3 sets - 10  reps - Seated Forearm Pronation and Supination AROM  -  1 x daily - 7 x weekly - 3 sets - 10 reps   ASSESSMENT:   CLINICAL IMPRESSION: Pt arrives with report of change in status last night (see subjective). Denies any change in neuro symptoms (endorses numbness along dorsal aspect of forearm/hand that is baseline). On observation there appears to be a tender deformity of medial aspect of distal humerus with accompanying bruising. Subsequently deferred treatment on this date - using interpreter services pt is able to communicate w/ referring provider office via telephone to notify of symptoms, reports follow up scheduled. This Clinical research associate also reached out to referring provider via secure chat notifying them of change in status, deferral of treatment on this date, appreciate their input. Education provided on monitoring for changes in symptoms prior to being seen for follow up, monitoring for red flags/neuro changes, as well as continued communication w/ provider prior to being seen as needed. Pt verbalizes understanding and agreement with this plan, departs session in no acute distress with no change in symptoms.      OBJECTIVE IMPAIRMENTS: decreased activity tolerance, decreased endurance, decreased mobility, decreased ROM, decreased strength, hypomobility, increased edema, impaired UE functional use, postural dysfunction, and pain.    ACTIVITY LIMITATIONS: carrying, lifting, sleeping, transfers, bathing, toileting, dressing, reach over head, and hygiene/grooming   PARTICIPATION LIMITATIONS: meal prep, cleaning, laundry, driving, community activity, and occupation   PERSONAL FACTORS: Profession are also affecting patient's functional outcome.    REHAB POTENTIAL: Good   CLINICAL DECISION MAKING: Stable/uncomplicated   EVALUATION COMPLEXITY: Low     GOALS: Goals reviewed with patient? No   SHORT TERM GOALS: Target date: 10/02/2022     Pt will demonstrate appropriate understanding and  performance of initially prescribed HEP in order to facilitate improved independence with management of symptoms.  Baseline: HEP provided on eval Goal status: INITIAL    2. Pt will improve greater than 1x MCD on FOTO in order to demonstrate improved perception of function due to symptoms.            Baseline: not assessed at eval d/t time constraints  Status: intake 31% taken at 3rd visit            Goal status: INITIAL    LONG TERM GOALS: Target date: 10/31/2022     Pt will meet predicted score on FOTO in order to demonstrate improved perception of function due to symptoms. Baseline: not assessed at eval due to time constraints Goal status: INITIAL   2.  Pt will demonstrate at least 140 degrees of active shoulder elevation on B in order to demonstrate improved tolerance to work activities Baseline: see ROM chart above Goal status: INITIAL   3.  Pt will demonstrate 0-140 active elbow extension-flexion on LUE for improved mechanics with daily tasks Baseline: see ROM chart above Goal status: INITIAL   4. Pt will report/demonstrate ability to perform upper body dressing with less than 2 point increase in pain on NPS in order to indicative improved tolerance/independence with functional tasks.            Baseline: up to 8/10 with typical daily tasks            Goal status: INITIAL    PLAN:   PT FREQUENCY: 2x/week   PT DURATION: 8 weeks   PLANNED INTERVENTIONS: Therapeutic exercises, Therapeutic activity, Neuromuscular re-education, Balance training, Gait training, Patient/Family education, Self Care, Joint mobilization, DME instructions, Dry Needling, Cryotherapy, Moist heat, scar mobilization, Taping, Manual therapy, and Re-evaluation   PLAN FOR NEXT  SESSION:  Continue pending provider assessment of change in pt status.   PER MD on 09/19/22 : "continue aggressive ROM of elbow. Goal to be able to reach mouth and back of head in next 3-4 weeks."     Ashley Murrain PT,  DPT 09/25/2022 10:17 AM

## 2022-09-27 ENCOUNTER — Ambulatory Visit: Payer: Self-pay | Admitting: Physical Therapy

## 2022-09-27 ENCOUNTER — Encounter: Payer: Self-pay | Admitting: Physical Therapy

## 2022-09-27 DIAGNOSIS — M6281 Muscle weakness (generalized): Secondary | ICD-10-CM

## 2022-09-27 DIAGNOSIS — M25522 Pain in left elbow: Secondary | ICD-10-CM

## 2022-09-27 DIAGNOSIS — M25622 Stiffness of left elbow, not elsewhere classified: Secondary | ICD-10-CM

## 2022-09-27 NOTE — Therapy (Addendum)
OUTPATIENT PHYSICAL THERAPY TREATMENT NOTE + NO VISIT DISCHARGE ADDENDUM   Patient Name: Adrian Hall MRN: [id number] DOB:September 20, 1976, 46 y.o., male Today's Date: 09/27/2022  PCP: none in chart   REFERRING PROVIDER: Shona Needles MD  END OF SESSION:   PT End of Session - 09/27/22 0850     Visit Number 6    Number of Visits 17    Date for PT Re-Evaluation 10/31/22    Authorization Type no coverage in chart    PT Start Time 0849    PT Stop Time 0940    PT Time Calculation (min) 51 min               History reviewed. No pertinent past medical history. Past Surgical History:  Procedure Laterality Date   ORIF HUMERUS FRACTURE Left 08/17/2022   Procedure: OPEN REDUCTION INTERNAL FIXATION (ORIF) DISTAL HUMERUS FRACTURE;  Surgeon: Shona Needles, MD;  Location: [facility];  Service: Orthopedics;  Laterality: Left;   Patient Active Problem List   Diagnosis Date Noted   Closed fracture of left distal humerus     REFERRING DIAG: L distal humerus fracture  THERAPY DIAG:  Pain in left elbow  Stiffness of left elbow, not elsewhere classified  Muscle weakness (generalized)  Rationale for Evaluation and Treatment Rehabilitation  PERTINENT HISTORY: L humeral ORIF 08/17/22  PRECAUTIONS: S/p L humeral ORIF 08/17/22, NWB LUE with unrestricted ROM of elbow   SUBJECTIVE:                                                                                                                                                                                      SUBJECTIVE STATEMENT:   I saw MD who said the elbow xrays look good. He said the protrusion is more noticeable due to the decreased swelling. He also discontinued the sling. He said I can continue PT.   Primary PT also received the following message from PA-C ""Good morning, I saw patient today in clinic. His post-op elbow swelling has come down significantly and because of this the hardware is more prominent. There are no signs of  hardware failure or loosening noted on today's imaging. I discussed with patient that everything looks stable. He may continue with aggressive elbow ROM as tolerated. We discussed possible hardware removal once fracture has fully healed. "  PAIN:  Are you having pain: yes: 5/10 Location: L elbow and wrist, mild numbness/tingling dorsal thumb How would you describe your pain? Difficulty describing, pulsing  Best in past week: 0/10 Worst in past week: 8/10 Aggravating factors: use of LUE, sleeping, prolonged positioning Easing factors: medication, rest, sling   OBJECTIVE: (objective measures completed  at initial evaluation unless otherwise dated)   DIAGNOSTIC FINDINGS:  S/p ORIF L humerus, ROM unrestricted, NWB   PATIENT SURVEYS:  FOTO deferred given time constraints and use of interpreter services, will plan to assess next visit as able FOTO Intake 09/14/22: : 31% with prediction 63%   COGNITION: Overall cognitive status: Within functional limits for tasks assessed                                  SENSATION: Light touch intact B UE aside from diminished on anterior/dorsal aspect of LUE distal to surgical site   POSTURE/OBSERVATION: Guarded posture, sling use, increased L UT elevation Incision on posterior aspect of UE unremarkable, appears clean and intact Mild bruising just proximal to anterior aspect of elbow; mild swelling in fingers but not outside normal limits given procedure and sling use   UPPER EXTREMITY ROM:    ROM Right eval AROM/PROM Left eval ROM 09/13/22 PROM 09/20/22 AROM 09/22/22 09/27/22 AROM  Shoulder flexion 148 p and significant UT compensation Not formally tested (see comment)      Shoulder extension          Shoulder abduction          Shoulder adduction          Shoulder internal rotation          Shoulder external rotation          Elbow flexion WNL 85/NT   Supine 96 Supine 98 95  Elbow extension WNL -35/-30 -32 supine  Supine -28 Supine -25 -28   Wrist flexion          Wrist extension          Wrist ulnar deviation          Wrist radial deviation          Wrist pronation   70/NT      Wrist supination   60/NT       (Blank rows = not tested) Comments: able to make fist on L although limited d/t mild swelling (as expected post operatively with sling use), full finger extension. Serial opposition on L WNL for ROM. Shoulder ROM NT on L due to symptom irritability but pt able to achieve ~80 deg flexion at least with functional observation   UPPER EXTREMITY MMT:   MMT Right eval Left eval  Shoulder flexion      Shoulder extension      Shoulder abduction      Shoulder adduction      Shoulder internal rotation      Shoulder external rotation      Middle trapezius      Lower trapezius      Elbow flexion      Elbow extension      Wrist flexion      Wrist extension      Wrist ulnar deviation      Wrist radial deviation      Wrist pronation      Wrist supination      Grip strength (lbs)      (Blank rows = not tested) Comments: MMT deferred on eval given acuity of surgery   JOINT MOBILITY TESTING:  Deferred given acuity of surgery   PALPATION:  No overt muscular tenderness             TODAY'S TREATMENT:  [facility] Adult PT Treatment:                                                DATE: 09/27/22 Therapeutic Exercise: Pendulums Standing scap retraction Standing at table AAROM shoulder flexion and ER using ohysioball Standing and supine tricep extension yellow band Supine chest press to pullovers with wooden dowel  Supine with shoulder stabilized at 90 deg flexion (elbow AROM extension to flexion)  Manual Therapy: Passive elbow flexion and extension, passive shoulder rom flexion, abduction , IR, ER  to tolerance  Modalities: HMP to left elbow x 10 minutes    [facility] Adult PT Treatment:                                                 DATE: 09/25/22 No charge - see subjective/assessment, treatment deferred on this date  [facility] Adult PT Treatment:                                                DATE: 09/22/22 Therapeutic Exercise: Swiss ball supported shoulder/elbow rollout 2x5, cues for precautions and form, appropriate ROM Seated scap retraction, x20 cues for reduced compensation Seated elbow flexion/ext AAROM elbow supported on table w/ pillow, 2x10, cues for appropriate ROM and contralateral UE assist  Manual Therapy: Supine passive physiological movement  elbow flex/extension, humerus stabilized for comfort Gentle STM biceps + proximal triceps, seated w/ pillow support  Gentle scar mobilization (healing well but noted reduced pliability of skin around healed incision), seated w/ pillow support                               [facility] Adult PT Treatment:                                                DATE: 09/20/22  Therapeutic Exercise: Seated (arm resting on high mat table) A/AROM elbow and wrist with and without over pressure in the following:  Elbow flexion  Elbow Ext    Standing pendulums Manual Therapy Supine passive Elbow extension and flexion with stabilization of the humerus.  Modalities:  HMP to left elbow supine with pillows elevating left arm x 10 minutes   [facility] Adult PT Treatment:                                                DATE: 09/14/22 FOTO INTAKE 31% Therapeutic Exercise: Seated (arm resting on high mat table) A/AROM elbow and wrist with and without over pressure in the following:  Elbow flexion  Elbow Ext Elbow Pronation Elbow Supination  Wrist Flexion Wrist ext Radial deviation  Towel squeeze 5 sec x 10 Standing pendulums Manual Therapy Gentle effleurage strokes to forearm and bicep sitting with arm propped on edge of high mat  table Modalities:  Ice pack to left elbow supine with pillows elevating left arm x 10 minutes  Self Care Instructed pt in appropriate positioning with LUE  elevated above heart using pillows to decrease edema.     PATIENT EDUCATION: Education details: discussion re: change in symptoms, monitoring symptoms, communication w/ provider Person educated: Patient Education method: Explanation, Demonstration, Tactile cues, Verbal cues Education comprehension: verbalized understanding, returned demonstration, verbal cues required, tactile cues required, and needs further education     HOME EXERCISE PROGRAM: Access Code: [email or web address] URL: [email or web address] Date: 09/05/2022 Prepared by: Enis Slipper   Exercises - Seated Elbow Flexion AAROM  - 1 x daily - 7 x weekly - 3 sets - 10 reps - Seated Forearm Pronation and Supination AROM  - 1 x daily - 7 x weekly - 3 sets - 10 reps   ASSESSMENT:   CLINICAL IMPRESSION: Pt arrives with report of decreased pain compared to previous visit. He saw PA-C due to increased pain and protrusion at distal humerous. Xrays were performed in MD office and his ORIF is stable without loosening. He was cleared by PA-C to continue PT for aggressive ROM and DC sling. AROM appears unchanged since previous measurements. Continued with AAROM, AROM and PROM. Gentle strengthening for elbow flexion and ext with yellow band tolerated well. He has increased pain with end range stretching in elbow and shoulder.  HMP applied post session to decrease pain.       OBJECTIVE IMPAIRMENTS: decreased activity tolerance, decreased endurance, decreased mobility, decreased ROM, decreased strength, hypomobility, increased edema, impaired UE functional use, postural dysfunction, and pain.    ACTIVITY LIMITATIONS: carrying, lifting, sleeping, transfers, bathing, toileting, dressing, reach over head, and hygiene/grooming   PARTICIPATION LIMITATIONS: meal prep, cleaning, laundry, driving, community activity, and occupation   PERSONAL FACTORS: Profession are also affecting patient's functional outcome.    REHAB POTENTIAL: Good    CLINICAL DECISION MAKING: Stable/uncomplicated   EVALUATION COMPLEXITY: Low     GOALS: Goals reviewed with patient? No   SHORT TERM GOALS: Target date: 10/02/2022     Pt will demonstrate appropriate understanding and performance of initially prescribed HEP in order to facilitate improved independence with management of symptoms.  Baseline: HEP provided on eval Goal status: INITIAL    2. Pt will improve greater than 1x MCD on FOTO in order to demonstrate improved perception of function due to symptoms.            Baseline: not assessed at eval d/t time constraints  Status: intake 31% taken at 3rd visit            Goal status: INITIAL    LONG TERM GOALS: Target date: 10/31/2022     Pt will meet predicted score on FOTO in order to demonstrate improved perception of function due to symptoms. Baseline: not assessed at eval due to time constraints Goal status: INITIAL   2.  Pt will demonstrate at least 140 degrees of active shoulder elevation on B in order to demonstrate improved tolerance to work activities Baseline: see ROM chart above Goal status: INITIAL   3.  Pt will demonstrate 0-140 active elbow extension-flexion on LUE for improved mechanics with daily tasks Baseline: see ROM chart above Goal status: INITIAL   4. Pt will report/demonstrate ability to perform upper body dressing with less than 2 point increase in pain on NPS in order to indicative improved tolerance/independence with functional tasks.            Baseline: up to  8/10 with typical daily tasks            Goal status: INITIAL    PLAN:   PT FREQUENCY: 2x/week   PT DURATION: 8 weeks   PLANNED INTERVENTIONS: Therapeutic exercises, Therapeutic activity, Neuromuscular re-education, Balance training, Gait training, Patient/Family education, Self Care, Joint mobilization, DME instructions, Dry Needling, Cryotherapy, Moist heat, scar mobilization, Taping, Manual therapy, and Re-evaluation   PLAN FOR NEXT SESSION:   Continue pending provider assessment of change in pt status.   PER MD on 09/19/22 : "continue aggressive ROM of elbow. Goal to be able to reach mouth and back of head in next 3-4 weeks."     Hessie Diener, PTA 09/27/22 10:33 AM Phone: [phone] Fax: [phone]     Addendum for no visit discharge 11/27/22:  PHYSICAL THERAPY DISCHARGE SUMMARY  Visits from Start of Care: 6  Current functional level related to goals / functional outcomes: unknown   Remaining deficits: Unknown - see above for most recent assessment   Education / Equipment: unknown   Patient unable to agree to discharge due to lack of follow up. Patient goals were  unable to be assessed as pt not seen in clinic since 09/27/22 . Patient is being discharged due to not returning since the last visit.    Leeroy Cha PT, DPT 11/27/2022 2:10 PM

## 2022-09-29 NOTE — Therapy (Incomplete)
OUTPATIENT PHYSICAL THERAPY TREATMENT NOTE (NO CHARGE VISIT)   Patient Name: Adrian Hall MRN: [id number] DOB:[birth year], 46 y.o., male Today's Date: 09/29/2022  PCP: none in chart   REFERRING PROVIDER: Roby Lofts MD  END OF SESSION:       No past medical history on file. Past Surgical History:  Procedure Laterality Date   ORIF HUMERUS FRACTURE Left 08/17/2022   Procedure: OPEN REDUCTION INTERNAL FIXATION (ORIF) DISTAL HUMERUS FRACTURE;  Surgeon: Roby Lofts, MD;  Location: [facility] OR;  Service: Orthopedics;  Laterality: Left;   Patient Active Problem List   Diagnosis Date Noted   Closed fracture of left distal humerus     REFERRING DIAG: L distal humerus fracture  THERAPY DIAG:  No diagnosis found.  Rationale for Evaluation and Treatment Rehabilitation  PERTINENT HISTORY: L humeral ORIF 08/17/22  PRECAUTIONS: S/p L humeral ORIF 08/17/22, NWB LUE with unrestricted ROM of elbow   SUBJECTIVE:                                                                                                                                                                                      SUBJECTIVE STATEMENT:   I saw MD who said the elbow xrays look good. He said the protrusion is more noticeable due to the decreased swelling. He also discontinued the sling. He said I can continue PT.   Primary PT also received the following message from PA-C ""Good morning, I saw patient today in clinic. His post-op elbow swelling has come down significantly and because of this the hardware is more prominent. There are no signs of hardware failure or loosening noted on today's imaging. I discussed with patient that everything looks stable. He may continue with aggressive elbow ROM as tolerated. We discussed possible hardware removal once fracture has fully healed. " ***  PAIN:  Are you having pain: yes: 5/10 Location: L elbow and wrist, mild numbness/tingling dorsal thumb How would you describe  your pain? Difficulty describing, pulsing  Best in past week: 0/10 Worst in past week: 8/10 Aggravating factors: use of LUE, sleeping, prolonged positioning Easing factors: medication, rest, sling   OBJECTIVE: (objective measures completed at initial evaluation unless otherwise dated)   DIAGNOSTIC FINDINGS:  S/p ORIF L humerus, ROM unrestricted, NWB   PATIENT SURVEYS:  FOTO deferred given time constraints and use of interpreter services, will plan to assess next visit as able FOTO Intake 09/14/22: : 31% with prediction 63%   COGNITION: Overall cognitive status: Within functional limits for tasks assessed  SENSATION: Light touch intact B UE aside from diminished on anterior/dorsal aspect of LUE distal to surgical site   POSTURE/OBSERVATION: Guarded posture, sling use, increased L UT elevation Incision on posterior aspect of UE unremarkable, appears clean and intact Mild bruising just proximal to anterior aspect of elbow; mild swelling in fingers but not outside normal limits given procedure and sling use   UPPER EXTREMITY ROM:    ROM Right eval AROM/PROM Left eval ROM 09/13/22 PROM 09/20/22 AROM 09/22/22 09/27/22 AROM  Shoulder flexion 148 p and significant UT compensation Not formally tested (see comment)      Shoulder extension          Shoulder abduction          Shoulder adduction          Shoulder internal rotation          Shoulder external rotation          Elbow flexion WNL 85/NT   Supine 96 Supine 98 95  Elbow extension WNL -35/-30 -32 supine  Supine -28 Supine -25 -28  Wrist flexion          Wrist extension          Wrist ulnar deviation          Wrist radial deviation          Wrist pronation   70/NT      Wrist supination   60/NT       (Blank rows = not tested) Comments: able to make fist on L although limited d/t mild swelling (as expected post operatively with sling use), full finger extension. Serial opposition on L WNL  for ROM. Shoulder ROM NT on L due to symptom irritability but pt able to achieve ~80 deg flexion at least with functional observation   UPPER EXTREMITY MMT:   MMT Right eval Left eval  Shoulder flexion      Shoulder extension      Shoulder abduction      Shoulder adduction      Shoulder internal rotation      Shoulder external rotation      Middle trapezius      Lower trapezius      Elbow flexion      Elbow extension      Wrist flexion      Wrist extension      Wrist ulnar deviation      Wrist radial deviation      Wrist pronation      Wrist supination      Grip strength (lbs)      (Blank rows = not tested) Comments: MMT deferred on eval given acuity of surgery   JOINT MOBILITY TESTING:  Deferred given acuity of surgery   PALPATION:  No overt muscular tenderness             TODAY'S TREATMENT:                                                                                                   [facility] Adult PT Treatment:  DATE: 10/02/22 Therapeutic Exercise: *** Manual Therapy: *** Neuromuscular re-ed: *** Therapeutic Activity: *** Modalities: *** Self Care: ***   [facility] Adult PT Treatment:                                                DATE: 09/27/22 Therapeutic Exercise: Pendulums Standing scap retraction Standing at table AAROM shoulder flexion and ER using ohysioball Standing and supine tricep extension yellow band Supine chest press to pullovers with wooden dowel  Supine with shoulder stabilized at 90 deg flexion (elbow AROM extension to flexion)  Manual Therapy: Passive elbow flexion and extension, passive shoulder rom flexion, abduction , IR, ER  to tolerance  Modalities: HMP to left elbow x 10 minutes    [facility] Adult PT Treatment:                                                DATE: 09/25/22 No charge - see subjective/assessment, treatment deferred on this date  [facility] Adult PT Treatment:                                                 DATE: 09/22/22 Therapeutic Exercise: Swiss ball supported shoulder/elbow rollout 2x5, cues for precautions and form, appropriate ROM Seated scap retraction, x20 cues for reduced compensation Seated elbow flexion/ext AAROM elbow supported on table w/ pillow, 2x10, cues for appropriate ROM and contralateral UE assist  Manual Therapy: Supine passive physiological movement  elbow flex/extension, humerus stabilized for comfort Gentle STM biceps + proximal triceps, seated w/ pillow support  Gentle scar mobilization (healing well but noted reduced pliability of skin around healed incision), seated w/ pillow support                               [facility] Adult PT Treatment:                                                DATE: 09/20/22  Therapeutic Exercise: Seated (arm resting on high mat table) A/AROM elbow and wrist with and without over pressure in the following:  Elbow flexion  Elbow Ext    Standing pendulums Manual Therapy Supine passive Elbow extension and flexion with stabilization of the humerus.  Modalities:  HMP to left elbow supine with pillows elevating left arm x 10 minutes      PATIENT EDUCATION: Education details: rationale for interventions Person educated: Patient Education method: Explanation, Demonstration, Tactile cues, Verbal cues Education comprehension: verbalized understanding, returned demonstration, verbal cues required, tactile cues required, and needs further education     HOME EXERCISE PROGRAM: Access Code: [email or web address] URL: [email or web address] Date: 09/05/2022 Prepared by: Fransisco Hertz   Exercises - Seated Elbow Flexion AAROM  - 1 x daily - 7 x weekly - 3 sets - 10 reps - Seated Forearm Pronation and Supination AROM  - 1 x daily - 7 x weekly - 3 sets - 10 reps  ASSESSMENT:   CLINICAL IMPRESSION: ***  *** Pt arrives with report of decreased pain compared to previous visit. He saw PA-C due to increased pain and  protrusion at distal humerous. Xrays were performed in MD office and his ORIF is stable without loosening. He was cleared by PA-C to continue PT for aggressive ROM and DC sling. AROM appears unchanged since previous measurements. Continued with AAROM, AROM and PROM. Gentle strengthening for elbow flexion and ext with yellow band tolerated well. He has increased pain with end range stretching in elbow and shoulder.  HMP applied post session to decrease pain.       OBJECTIVE IMPAIRMENTS: decreased activity tolerance, decreased endurance, decreased mobility, decreased ROM, decreased strength, hypomobility, increased edema, impaired UE functional use, postural dysfunction, and pain.    ACTIVITY LIMITATIONS: carrying, lifting, sleeping, transfers, bathing, toileting, dressing, reach over head, and hygiene/grooming   PARTICIPATION LIMITATIONS: meal prep, cleaning, laundry, driving, community activity, and occupation   PERSONAL FACTORS: Profession are also affecting patient's functional outcome.    REHAB POTENTIAL: Good   CLINICAL DECISION MAKING: Stable/uncomplicated   EVALUATION COMPLEXITY: Low     GOALS: Goals reviewed with patient? No   SHORT TERM GOALS: Target date: 10/02/2022     Pt will demonstrate appropriate understanding and performance of initially prescribed HEP in order to facilitate improved independence with management of symptoms.  Baseline: HEP provided on eval Goal status: INITIAL    2. Pt will improve greater than 1x MCD on FOTO in order to demonstrate improved perception of function due to symptoms.            Baseline: not assessed at eval d/t time constraints  Status: intake 31% taken at 3rd visit            Goal status: INITIAL    LONG TERM GOALS: Target date: 10/31/2022     Pt will meet predicted score on FOTO in order to demonstrate improved perception of function due to symptoms. Baseline: not assessed at eval due to time constraints Goal status: INITIAL    2.  Pt will demonstrate at least 140 degrees of active shoulder elevation on B in order to demonstrate improved tolerance to work activities Baseline: see ROM chart above Goal status: INITIAL   3.  Pt will demonstrate 0-140 active elbow extension-flexion on LUE for improved mechanics with daily tasks Baseline: see ROM chart above Goal status: INITIAL   4. Pt will report/demonstrate ability to perform upper body dressing with less than 2 point increase in pain on NPS in order to indicative improved tolerance/independence with functional tasks.            Baseline: up to 8/10 with typical daily tasks            Goal status: INITIAL    PLAN:   PT FREQUENCY: 2x/week   PT DURATION: 8 weeks   PLANNED INTERVENTIONS: Therapeutic exercises, Therapeutic activity, Neuromuscular re-education, Balance training, Gait training, Patient/Family education, Self Care, Joint mobilization, DME instructions, Dry Needling, Cryotherapy, Moist heat, scar mobilization, Taping, Manual therapy, and Re-evaluation   PLAN FOR NEXT SESSION:  ***  PER MD on 09/19/22 : "continue aggressive ROM of elbow. Goal to be able to reach mouth and back of head in next 3-4 weeks."     Ashley Murrainavid A Mohsin Crum PT, DPT 09/29/2022 8:42 AM

## 2022-10-02 ENCOUNTER — Ambulatory Visit: Payer: Self-pay | Admitting: Physical Therapy

## 2022-10-03 NOTE — Therapy (Incomplete)
OUTPATIENT PHYSICAL THERAPY TREATMENT NOTE    Patient Name: Adrian Hall MRN: [id number] DOB:09-02-1976, 46 y.o., male Today's Date: 10/03/2022  PCP: none in chart   REFERRING PROVIDER: Roby LoftsHaddix, Kevin P MD  END OF SESSION:       No past medical history on file. Past Surgical History:  Procedure Laterality Date   ORIF HUMERUS FRACTURE Left 08/17/2022   Procedure: OPEN REDUCTION INTERNAL FIXATION (ORIF) DISTAL HUMERUS FRACTURE;  Surgeon: Roby LoftsHaddix, Kevin P, MD;  Location: [facility] OR;  Service: Orthopedics;  Laterality: Left;   Patient Active Problem List   Diagnosis Date Noted   Closed fracture of left distal humerus     REFERRING DIAG: L distal humerus fracture  THERAPY DIAG:  No diagnosis found.  Rationale for Evaluation and Treatment Rehabilitation  PERTINENT HISTORY: L humeral ORIF 08/17/22  PRECAUTIONS: S/p L humeral ORIF 08/17/22, NWB LUE with unrestricted ROM of elbow   SUBJECTIVE:                                                                                                                                                                                      SUBJECTIVE STATEMENT:   I saw MD who said the elbow xrays look good. He said the protrusion is more noticeable due to the decreased swelling. He also discontinued the sling. He said I can continue PT.   Primary PT also received the following message from PA-C ""Good morning, I saw patient today in clinic. His post-op elbow swelling has come down significantly and because of this the hardware is more prominent. There are no signs of hardware failure or loosening noted on today's imaging. I discussed with patient that everything looks stable. He may continue with aggressive elbow ROM as tolerated. We discussed possible hardware removal once fracture has fully healed. " ***  PAIN:  Are you having pain: yes: 5/10 Location: L elbow and wrist, mild numbness/tingling dorsal thumb How would you describe your pain?  Difficulty describing, pulsing  Best in past week: 0/10 Worst in past week: 8/10 Aggravating factors: use of LUE, sleeping, prolonged positioning Easing factors: medication, rest, sling   OBJECTIVE: (objective measures completed at initial evaluation unless otherwise dated)   DIAGNOSTIC FINDINGS:  S/p ORIF L humerus, ROM unrestricted, NWB   PATIENT SURVEYS:  FOTO deferred given time constraints and use of interpreter services, will plan to assess next visit as able FOTO Intake 09/14/22: : 31% with prediction 63%   COGNITION: Overall cognitive status: Within functional limits for tasks assessed  SENSATION: Light touch intact B UE aside from diminished on anterior/dorsal aspect of LUE distal to surgical site   POSTURE/OBSERVATION: Guarded posture, sling use, increased L UT elevation Incision on posterior aspect of UE unremarkable, appears clean and intact Mild bruising just proximal to anterior aspect of elbow; mild swelling in fingers but not outside normal limits given procedure and sling use   UPPER EXTREMITY ROM:    ROM Right eval AROM/PROM Left eval ROM 09/13/22 PROM 09/20/22 AROM 09/22/22 09/27/22 AROM  Shoulder flexion 148 p and significant UT compensation Not formally tested (see comment)      Shoulder extension          Shoulder abduction          Shoulder adduction          Shoulder internal rotation          Shoulder external rotation          Elbow flexion WNL 85/NT   Supine 96 Supine 98 95  Elbow extension WNL -35/-30 -32 supine  Supine -28 Supine -25 -28  Wrist flexion          Wrist extension          Wrist ulnar deviation          Wrist radial deviation          Wrist pronation   70/NT      Wrist supination   60/NT       (Blank rows = not tested) Comments: able to make fist on L although limited d/t mild swelling (as expected post operatively with sling use), full finger extension. Serial opposition on L WNL for ROM.  Shoulder ROM NT on L due to symptom irritability but pt able to achieve ~80 deg flexion at least with functional observation   UPPER EXTREMITY MMT:   MMT Right eval Left eval  Shoulder flexion      Shoulder extension      Shoulder abduction      Shoulder adduction      Shoulder internal rotation      Shoulder external rotation      Middle trapezius      Lower trapezius      Elbow flexion      Elbow extension      Wrist flexion      Wrist extension      Wrist ulnar deviation      Wrist radial deviation      Wrist pronation      Wrist supination      Grip strength (lbs)      (Blank rows = not tested) Comments: MMT deferred on eval given acuity of surgery   JOINT MOBILITY TESTING:  Deferred given acuity of surgery   PALPATION:  No overt muscular tenderness             TODAY'S TREATMENT:                                                                                                   [facility] Adult PT Treatment:  DATE: 10/04/22 Therapeutic Exercise: *** Manual Therapy: *** Neuromuscular re-ed: *** Therapeutic Activity: *** Modalities: *** Self Care: ***   [facility] Adult PT Treatment:                                                DATE: 09/27/22 Therapeutic Exercise: Pendulums Standing scap retraction Standing at table AAROM shoulder flexion and ER using ohysioball Standing and supine tricep extension yellow band Supine chest press to pullovers with wooden dowel  Supine with shoulder stabilized at 90 deg flexion (elbow AROM extension to flexion)  Manual Therapy: Passive elbow flexion and extension, passive shoulder rom flexion, abduction , IR, ER  to tolerance  Modalities: HMP to left elbow x 10 minutes    [facility] Adult PT Treatment:                                                DATE: 09/25/22 No charge - see subjective/assessment, treatment deferred on this date  [facility] Adult PT Treatment:                                                 DATE: 09/22/22 Therapeutic Exercise: Swiss ball supported shoulder/elbow rollout 2x5, cues for precautions and form, appropriate ROM Seated scap retraction, x20 cues for reduced compensation Seated elbow flexion/ext AAROM elbow supported on table w/ pillow, 2x10, cues for appropriate ROM and contralateral UE assist  Manual Therapy: Supine passive physiological movement  elbow flex/extension, humerus stabilized for comfort Gentle STM biceps + proximal triceps, seated w/ pillow support  Gentle scar mobilization (healing well but noted reduced pliability of skin around healed incision), seated w/ pillow support                               [facility] Adult PT Treatment:                                                DATE: 09/20/22  Therapeutic Exercise: Seated (arm resting on high mat table) A/AROM elbow and wrist with and without over pressure in the following:  Elbow flexion  Elbow Ext    Standing pendulums Manual Therapy Supine passive Elbow extension and flexion with stabilization of the humerus.  Modalities:  HMP to left elbow supine with pillows elevating left arm x 10 minutes      PATIENT EDUCATION: Education details: rationale for interventions Person educated: Patient Education method: Explanation, Demonstration, Tactile cues, Verbal cues Education comprehension: verbalized understanding, returned demonstration, verbal cues required, tactile cues required, and needs further education     HOME EXERCISE PROGRAM: Access Code: [email or web address] URL: [email or web address] Date: 09/05/2022 Prepared by: Fransisco Hertz   Exercises - Seated Elbow Flexion AAROM  - 1 x daily - 7 x weekly - 3 sets - 10 reps - Seated Forearm Pronation and Supination AROM  - 1 x daily - 7 x weekly - 3 sets - 10 reps  ASSESSMENT:   CLINICAL IMPRESSION: ***  *** Pt arrives with report of decreased pain compared to previous visit. He saw PA-C due to increased pain and protrusion at  distal humerous. Xrays were performed in MD office and his ORIF is stable without loosening. He was cleared by PA-C to continue PT for aggressive ROM and DC sling. AROM appears unchanged since previous measurements. Continued with AAROM, AROM and PROM. Gentle strengthening for elbow flexion and ext with yellow band tolerated well. He has increased pain with end range stretching in elbow and shoulder.  HMP applied post session to decrease pain.       OBJECTIVE IMPAIRMENTS: decreased activity tolerance, decreased endurance, decreased mobility, decreased ROM, decreased strength, hypomobility, increased edema, impaired UE functional use, postural dysfunction, and pain.    ACTIVITY LIMITATIONS: carrying, lifting, sleeping, transfers, bathing, toileting, dressing, reach over head, and hygiene/grooming   PARTICIPATION LIMITATIONS: meal prep, cleaning, laundry, driving, community activity, and occupation   PERSONAL FACTORS: Profession are also affecting patient's functional outcome.    REHAB POTENTIAL: Good   CLINICAL DECISION MAKING: Stable/uncomplicated   EVALUATION COMPLEXITY: Low     GOALS: Goals reviewed with patient? No   SHORT TERM GOALS: Target date: 10/02/2022     Pt will demonstrate appropriate understanding and performance of initially prescribed HEP in order to facilitate improved independence with management of symptoms.  Baseline: HEP provided on eval Goal status: INITIAL    2. Pt will improve greater than 1x MCD on FOTO in order to demonstrate improved perception of function due to symptoms.            Baseline: not assessed at eval d/t time constraints  Status: intake 31% taken at 3rd visit            Goal status: INITIAL    LONG TERM GOALS: Target date: 10/31/2022     Pt will meet predicted score on FOTO in order to demonstrate improved perception of function due to symptoms. Baseline: not assessed at eval due to time constraints Goal status: INITIAL   2.  Pt will  demonstrate at least 140 degrees of active shoulder elevation on B in order to demonstrate improved tolerance to work activities Baseline: see ROM chart above Goal status: INITIAL   3.  Pt will demonstrate 0-140 active elbow extension-flexion on LUE for improved mechanics with daily tasks Baseline: see ROM chart above Goal status: INITIAL   4. Pt will report/demonstrate ability to perform upper body dressing with less than 2 point increase in pain on NPS in order to indicative improved tolerance/independence with functional tasks.            Baseline: up to 8/10 with typical daily tasks            Goal status: INITIAL    PLAN:   PT FREQUENCY: 2x/week   PT DURATION: 8 weeks   PLANNED INTERVENTIONS: Therapeutic exercises, Therapeutic activity, Neuromuscular re-education, Balance training, Gait training, Patient/Family education, Self Care, Joint mobilization, DME instructions, Dry Needling, Cryotherapy, Moist heat, scar mobilization, Taping, Manual therapy, and Re-evaluation   PLAN FOR NEXT SESSION:  ***  PER MD on 09/19/22 : "continue aggressive ROM of elbow. Goal to be able to reach mouth and back of head in next 3-4 weeks."     Ashley Murrain PT, DPT 10/03/2022 12:57 PM

## 2022-10-04 ENCOUNTER — Telehealth: Payer: Self-pay | Admitting: Physical Therapy

## 2022-10-04 ENCOUNTER — Ambulatory Visit: Payer: Self-pay | Admitting: Physical Therapy

## 2022-10-04 NOTE — Telephone Encounter (Signed)
Called pt w/ assistance of in person interpreter, Raquel, re: this morning's missed appt - went straight to voicemail, left message w/ call back number to schedule appointments
